# Patient Record
Sex: Female | Born: 1985 | Race: White | Hispanic: No | Marital: Married | Smoking: Never smoker
Health system: Southern US, Community
[De-identification: ages and names within clinical notes are randomized; demographics above are authoritative.]

## PROBLEM LIST (undated history)

## (undated) ENCOUNTER — Inpatient Hospital Stay (HOSPITAL_COMMUNITY): Payer: Self-pay

## (undated) DIAGNOSIS — Z789 Other specified health status: Secondary | ICD-10-CM

## (undated) HISTORY — PX: WISDOM TOOTH EXTRACTION: SHX21

---

## 2015-07-28 LAB — OB RESULTS CONSOLE GC/CHLAMYDIA
CHLAMYDIA, DNA PROBE: NEGATIVE
GC PROBE AMP, GENITAL: NEGATIVE

## 2015-07-28 LAB — OB RESULTS CONSOLE ABO/RH: RH Type: POSITIVE

## 2015-07-28 LAB — OB RESULTS CONSOLE HIV ANTIBODY (ROUTINE TESTING): HIV: NONREACTIVE

## 2015-07-28 LAB — OB RESULTS CONSOLE RPR: RPR: NONREACTIVE

## 2015-07-28 LAB — OB RESULTS CONSOLE RUBELLA ANTIBODY, IGM: RUBELLA: IMMUNE

## 2015-07-28 LAB — OB RESULTS CONSOLE HEPATITIS B SURFACE ANTIGEN: HEP B S AG: NEGATIVE

## 2015-07-28 LAB — OB RESULTS CONSOLE ANTIBODY SCREEN: ANTIBODY SCREEN: NEGATIVE

## 2016-02-05 LAB — OB RESULTS CONSOLE GBS: STREP GROUP B AG: NEGATIVE

## 2016-02-29 NOTE — H&P (Signed)
HPI: 31 y/o G1P0 @ 6276w3d estimated gestational age (as dated by LMP c/w 20 week ultrasound) presents for scheduled primary C-section due to breech presentation.   no Leaking of Fluid,   no Vaginal Bleeding,   no Uterine Contractions,  + Fetal Movement.  ROS: no HA, no epigastric pain, no visual changes.    Pregnancy complicated by: 1) Breech presentation- discussed ECV- pt declined, desire primary C-section 2) Size less than dates: Last US 12/5: breech/anterior/AGA (52%)  Prenatal Transfer Tool  Maternal Diabetes: No Genetic Screening: Normal Maternal Ultrasounds/Referrals: Normal Fetal Ultrasounds or other Referrals:  None Maternal Substance Abuse:  No Significant Maternal Medications:  None Significant Maternal Lab Results: Lab values include: Group B Strep negative   PNL:  GBS negative, Rub Immune, Hep B neg, RPR NR, HIV neg, GC/C neg, glucola: 82 Blood type: A positive  Immunizations: Tdap: 12/22/15 Flu: outside facility  OBHx: primip PMHx:  none Meds:  PNV Allergy:  No Known Allergies SurgHx: none SocHx:   no Tobacco, no  EtOH, no Illicit Drugs  O: Performed in office Gen. AAOx3, NAD CV.  RRR  No murmur.  Resp. CTAB, no wheeze or crackles. Abd. Gravid,  no tenderness,  no rigidity,  no guarding Extr.  no edema B/L , no calf tenderness  FHT: 130 (by doppler in office)  Labs: see orders  A/P:  31 y.o. G1P0 @ 3176w3d EGA who presents for scheduled C-section due to breech presentation -FWB:  Reassuring by doppler -NPO -LR @ 125cc/hr -SCDs to OR -Ancef 2g IV -Risk benefits and alternatives of cesarean section were discussed with the patient including but not limited to infection, bleeding, damage to bowel , bladder and baby with the need for further surgery. Pt voiced understanding and desires to proceed.   Myna HidalgoJennifer Georganne Siple, DO [phone] (pager) [phone] (office)

## 2016-03-01 ENCOUNTER — Encounter (HOSPITAL_COMMUNITY): Payer: Self-pay

## 2016-03-02 ENCOUNTER — Encounter (HOSPITAL_COMMUNITY)
Admission: RE | Admit: 2016-03-02 | Discharge: 2016-03-02 | Disposition: A | Discharge status: Home / Self Care | Payer: No Typology Code available for payment source | Source: Ambulatory Visit | Attending: Obstetrics & Gynecology | Admitting: Obstetrics & Gynecology

## 2016-03-02 DIAGNOSIS — Z3A39 39 weeks gestation of pregnancy: Secondary | ICD-10-CM | POA: Diagnosis not present

## 2016-03-02 DIAGNOSIS — Z0379 Encounter for other suspected maternal and fetal conditions ruled out: Secondary | ICD-10-CM | POA: Diagnosis not present

## 2016-03-02 DIAGNOSIS — Z538 Procedure and treatment not carried out for other reasons: Secondary | ICD-10-CM | POA: Diagnosis not present

## 2016-03-02 DIAGNOSIS — O321XX0 Maternal care for breech presentation, not applicable or unspecified: Secondary | ICD-10-CM | POA: Diagnosis present

## 2016-03-02 HISTORY — DX: Other specified health status: Z78.9

## 2016-03-02 LAB — CBC
HEMATOCRIT: 37.4 % (ref 36.0–46.0)
Hemoglobin: 13.1 g/dL (ref 12.0–15.0)
MCH: 30.9 pg (ref 26.0–34.0)
MCHC: 35 g/dL (ref 30.0–36.0)
MCV: 88.2 fL (ref 78.0–100.0)
PLATELETS: 184 10*3/uL (ref 150–400)
RBC: 4.24 MIL/uL (ref 3.87–5.11)
RDW: 13 % (ref 11.5–15.5)
WBC: 8.8 10*3/uL (ref 4.0–10.5)

## 2016-03-02 LAB — TYPE AND SCREEN
ABO/RH(D): A POS
ANTIBODY SCREEN: NEGATIVE

## 2016-03-02 LAB — ABO/RH: ABO/RH(D): A POS

## 2016-03-02 NOTE — Patient Instructions (Signed)
[address]oss  03/02/2016   Your procedure is scheduled on:  03/03/2016  [facility] through the [facility] Entrance of [facility] at 0530 AM.  Pick up the phone at the desk and dial [phone].   Call this number if you have problems the morning of surgery: [phone]   Remember:   Do not eat food:After Midnight.  Do not drink clear liquids: After Midnight.  Take these medicines the morning of surgery with A SIP OF WATER: na   Do not wear jewelry, make-up or nail polish.  Do not wear lotions, powders, or perfumes. Do not wear deodorant.  Do not shave 48 hours prior to surgery.  Do not bring valuables to the hospital.  [facility] is not   responsible for any belongings or valuables brought to the hospital.  Contacts, dentures or bridgework may not be worn into surgery.  Leave suitcase in the car. After surgery it may be brought to your room.  For patients admitted to the hospital, checkout time is 11:00 AM the day of              discharge.   Patients discharged the day of surgery will not be allowed to drive             home.  Name and phone number of your driver: na  Special Instructions:   N/A   Please read over the following fact sheets that you were given:   Surgical Site Infection Prevention

## 2016-03-03 ENCOUNTER — Encounter (HOSPITAL_COMMUNITY): Payer: Self-pay | Admitting: Anesthesiology

## 2016-03-03 ENCOUNTER — Encounter (HOSPITAL_COMMUNITY): Payer: Self-pay | Admitting: *Deleted

## 2016-03-03 ENCOUNTER — Telehealth (HOSPITAL_COMMUNITY): Payer: Self-pay | Admitting: *Deleted

## 2016-03-03 ENCOUNTER — Observation Stay (HOSPITAL_COMMUNITY)
Admission: AD | Admit: 2016-03-03 | Discharge: 2016-03-03 | Disposition: A | Discharge status: Home / Self Care | Payer: No Typology Code available for payment source | Source: Ambulatory Visit | Attending: Obstetrics & Gynecology | Admitting: Obstetrics & Gynecology

## 2016-03-03 ENCOUNTER — Encounter (HOSPITAL_COMMUNITY)
Admission: AD | Disposition: A | Discharge status: Home / Self Care | Payer: Self-pay | Source: Ambulatory Visit | Attending: Obstetrics & Gynecology

## 2016-03-03 DIAGNOSIS — Z0379 Encounter for other suspected maternal and fetal conditions ruled out: Principal | ICD-10-CM | POA: Insufficient documentation

## 2016-03-03 DIAGNOSIS — Z3493 Encounter for supervision of normal pregnancy, unspecified, third trimester: Secondary | ICD-10-CM

## 2016-03-03 DIAGNOSIS — Z3A39 39 weeks gestation of pregnancy: Secondary | ICD-10-CM | POA: Insufficient documentation

## 2016-03-03 DIAGNOSIS — Z538 Procedure and treatment not carried out for other reasons: Secondary | ICD-10-CM | POA: Insufficient documentation

## 2016-03-03 LAB — RPR: RPR Ser Ql: NONREACTIVE

## 2016-03-03 SURGERY — Surgical Case
Anesthesia: Regional

## 2016-03-03 MED ORDER — OXYTOCIN 10 UNIT/ML IJ SOLN
INTRAMUSCULAR | Status: AC
  Filled 2016-03-03: qty 4

## 2016-03-03 MED ORDER — ONDANSETRON HCL 4 MG/2ML IJ SOLN
INTRAMUSCULAR | Status: AC
  Filled 2016-03-03: qty 2

## 2016-03-03 MED ORDER — LACTATED RINGERS IV SOLN
INTRAVENOUS | Status: DC
  Administered 2016-03-03: 06:00:00 via INTRAVENOUS

## 2016-03-03 MED ORDER — CEFAZOLIN SODIUM-DEXTROSE 2-4 GM/100ML-% IV SOLN: 2.0000 g | INTRAVENOUS | Status: DC

## 2016-03-03 MED ORDER — MORPHINE SULFATE (PF) 0.5 MG/ML IJ SOLN
INTRAMUSCULAR | Status: AC
  Filled 2016-03-03: qty 10

## 2016-03-03 MED ORDER — FENTANYL CITRATE (PF) 100 MCG/2ML IJ SOLN
INTRAMUSCULAR | Status: AC
  Filled 2016-03-03: qty 2

## 2016-03-03 NOTE — Telephone Encounter (Signed)
Preadmission screen  

## 2016-03-03 NOTE — Progress Notes (Signed)
Pt presents today for scheduled C-section for breech presentation.  Bedside ultrasound performed- vertex presentation.  C-section not indicated, plan for expectant management with IOL around 41wks if she does not delivery prior to that date.  Today she reports no LOF, no VB, some increased pelvic pressure.    O: BP 121/88   Pulse 77   Temp 98.4 F (36.9 C) (Oral)   Resp 18   LMP 06/01/2015   SpO2 100%   Gen: NAD Abd: soft, non-tender SVE: closed/soft/posterior  FHT: 130 by US Ext: no edema, no calf tenderness bilaterally  30yo G1P0@ 6848w3d for scheduled C-section for breech, who is now vertex - Plan for discharge home with follow up in my office in one week - Reviewed labor room precautions  Myna HidalgoJennifer Jenita Rayfield, DO [phone] (pager) [phone] (office)

## 2016-03-03 NOTE — Discharge Instructions (Signed)
Introduction °Patient Name: ________________________________________________ Patient Due Date: ____________________ °What is a fetal movement count? °A fetal movement count is the number of times that you feel your baby move during a certain amount of time. This may also be called a fetal kick count. A fetal movement count is recommended for every pregnant woman. You may be asked to start counting fetal movements as early as week 28 of your pregnancy. °Pay attention to when your baby is most active. You may notice your baby's sleep and wake cycles. You may also notice things that make your baby move more. You should do a fetal movement count: °· When your baby is normally most active. °· At the same time each day. °A good time to count movements is while you are resting, after having something to eat and drink. °How do I count fetal movements? °1. Find a quiet, comfortable area. Sit, or lie down on your side. °2. Write down the date, the start time and stop time, and the number of movements that you felt between those two times. Take this information with you to your health care visits. °3. For 2 hours, count kicks, flutters, swishes, rolls, and jabs. You should feel at least 10 movements during 2 hours. °4. You may stop counting after you have felt 10 movements. °5. If you do not feel 10 movements in 2 hours, have something to eat and drink. Then, keep resting and counting for 1 hour. If you feel at least 4 movements during that hour, you may stop counting. °Contact a health care provider if: °· You feel fewer than 4 movements in 2 hours. °· Your baby is not moving like he or she usually does. °Date: ____________ Start time: ____________ Stop time: ____________ Movements: ____________ °Date: ____________ Start time: ____________ Stop time: ____________ Movements: ____________ °Date: ____________ Start time: ____________ Stop time: ____________ Movements: ____________ °Date: ____________ Start time: ____________  Stop time: ____________ Movements: ____________ °Date: ____________ Start time: ____________ Stop time: ____________ Movements: ____________ °Date: ____________ Start time: ____________ Stop time: ____________ Movements: ____________ °Date: ____________ Start time: ____________ Stop time: ____________ Movements: ____________ °Date: ____________ Start time: ____________ Stop time: ____________ Movements: ____________ °Date: ____________ Start time: ____________ Stop time: ____________ Movements: ____________ °This information is not intended to replace advice given to you by your health care provider. Make sure you discuss any questions you have with your health care provider. °Document Released: 02/24/2006 Document Revised: 09/24/2015 Document Reviewed: 03/06/2015 °Elsevier Interactive Patient Education © 2017 [employer] °Vaginal Delivery °Vaginal delivery means that you will give birth by pushing your baby out of your birth canal (vagina). A team of health care providers will help you before, during, and after vaginal delivery. Birth experiences are unique for every woman and every pregnancy, and birth experiences vary depending on where you choose to give birth. °What should I do to prepare for my baby's birth? °Before your baby is born, it is important to talk with your health care provider about: °· Your labor and delivery preferences. These may include: °¨ Medicines that you may be given. °¨ How you will manage your pain. This might include non-medical pain relief techniques or injectable pain relief such as epidural analgesia. °¨ How you and your baby will be monitored during labor and delivery. °¨ Who may be in the labor and delivery room with you. °¨ Your feelings about surgical delivery of your baby (cesarean delivery, or C-section) if this becomes necessary. °¨ Your feelings about receiving donated blood through an   IV tube (blood transfusion) if this becomes necessary. °· Whether you are able: °¨ To  take pictures or videos of the birth. °¨ To eat during labor and delivery. °¨ To move around, walk, or change positions during labor and delivery. °· What to expect after your baby is born, such as: °¨ Whether delayed umbilical cord clamping and cutting is offered. °¨ Who will care for your baby right after birth. °¨ Medicines or tests that may be recommended for your baby. °¨ Whether breastfeeding is supported in your hospital or birth center. °¨ How long you will be in the hospital or birth center. °· How any medical conditions you have may affect your baby or your labor and delivery experience. °To prepare for your baby's birth, you should also: °· Attend all of your health care visits before delivery (prenatal visits) as recommended by your health care provider. This is important. °· Prepare your home for your baby's arrival. Make sure that you have: °¨ Diapers. °¨ Baby clothing. °¨ Feeding equipment. °¨ Safe sleeping arrangements for you and your baby. °· Install a car seat in your vehicle. Have your car seat checked by a certified car seat installer to make sure that it is installed safely. °· Think about who will help you with your new baby at home for at least the first several weeks after delivery. °What can I expect when I arrive at the birth center or hospital? °Once you are in labor and have been admitted into the hospital or birth center, your health care provider may: °· Review your pregnancy history and any concerns you have. °· Insert an IV tube into one of your veins. This is used to give you fluids and medicines. °· Check your blood pressure, pulse, temperature, and heart rate (vital signs). °· Check whether your bag of water (amniotic sac) has broken (ruptured). °· Talk with you about your birth plan and discuss pain control options. °Monitoring °Your health care provider may monitor your contractions (uterine monitoring) and your baby's heart rate (fetal monitoring). You may need to be  monitored: °· Often, but not continuously (intermittently). °· All the time or for long periods at a time (continuously). Continuous monitoring may be needed if: °¨ You are taking certain medicines, such as medicine to relieve pain or make your contractions stronger. °¨ You have pregnancy or labor complications. °Monitoring may be done by: °· Placing a special stethoscope or a handheld monitoring device on your abdomen to check your baby's heartbeat, and feeling your abdomen for contractions. This method of monitoring does not continuously record your baby's heartbeat or your contractions. °· Placing monitors on your abdomen (external monitors) to record your baby's heartbeat and the frequency and length of contractions. You may not have to wear external monitors all the time. °· Placing monitors inside of your uterus (internal monitors) to record your baby's heartbeat and the frequency, length, and strength of your contractions. °¨ Your health care provider may use internal monitors if he or she needs more information about the strength of your contractions or your baby's heart rate. °¨ Internal monitors are put in place by passing a thin, flexible wire through your vagina and into your uterus. Depending on the type of monitor, it may remain in your uterus or on your baby's head until birth. °¨ Your health care provider will discuss the benefits and risks of internal monitoring with you and will ask for your permission before inserting the monitors. °· Telemetry. This is a   type of continuous monitoring that can be done with external or internal monitors. Instead of having to stay in bed, you are able to move around during telemetry. Ask your health care provider if telemetry is an option for you. °Physical exam °Your health care provider may perform a physical exam. This may include: °· Checking whether your baby is positioned: °¨ With the head toward your vagina (head-down). This is most common. °¨ With the head  toward the top of your uterus (head-up or breech). If your baby is in a breech position, your health care provider may try to turn your baby to a head-down position so you can deliver vaginally. If it does not seem that your baby can be born vaginally, your provider may recommend surgery to deliver your baby. In rare cases, you may be able to deliver vaginally if your baby is head-up (breech delivery). °¨ Lying sideways (transverse). Babies that are lying sideways cannot be delivered vaginally. °· Checking your cervix to determine: °¨ Whether it is thinning out (effacing). °¨ Whether it is opening up (dilating). °¨ How low your baby has moved into your birth canal. °What are the three stages of labor and delivery? °  °Normal labor and delivery is divided into the following three stages: °Stage 1 °· Stage 1 is the longest stage of labor, and it can last for hours or days. Stage 1 includes: °¨ Early labor. This is when contractions may be irregular, or regular and mild. Generally, early labor contractions are more than 10 minutes apart. °¨ Active labor. This is when contractions get longer, more regular, more frequent, and more intense. °¨ The transition phase. This is when contractions happen very close together, are very intense, and may last longer than during any other part of labor. °· Contractions generally feel mild, infrequent, and irregular at first. They get stronger, more frequent (about every 2-3 minutes), and more regular as you progress from early labor through active labor and transition. °· Many women progress through stage 1 naturally, but you may need help to continue making progress. If this happens, your health care provider may talk with you about: °¨ Rupturing your amniotic sac if it has not ruptured yet. °¨ Giving you medicine to help make your contractions stronger and more frequent. °· Stage 1 ends when your cervix is completely dilated to 4 inches (10 cm) and completely effaced. This happens  at the end of the transition phase. °Stage 2 °· Once your cervix is completely effaced and dilated to 4 inches (10 cm), you may start to feel an urge to push. It is common for the body to naturally take a rest before feeling the urge to push, especially if you received an epidural or certain other pain medicines. This rest period may last for up to 1-2 hours, depending on your unique labor experience. °· During stage 2, contractions are generally less painful, because pushing helps relieve contraction pain. Instead of contraction pain, you may feel stretching and burning pain, especially when the widest part of your baby's head passes through the vaginal opening (crowning). °· Your health care provider will closely monitor your pushing progress and your baby's progress through the vagina during stage 2. °· Your health care provider may massage the area of skin between your vaginal opening and anus (perineum) or apply warm compresses to your perineum. This helps it stretch as the baby's head starts to crown, which can help prevent perineal tearing. °¨ In some cases, an incision may   be made in your perineum (episiotomy) to allow the baby to pass through the vaginal opening. An episiotomy helps to make the opening of the vagina larger to allow more room for the baby to fit through. °· It is very important to breathe and focus so your health care provider can control the delivery of your baby's head. Your health care provider may have you decrease the intensity of your pushing, to help prevent perineal tearing. °· After delivery of your baby's head, the shoulders and the rest of the body generally deliver very quickly and without difficulty. °· Once your baby is delivered, the umbilical cord may be cut right away, or this may be delayed for 1-2 minutes, depending on your baby's health. This may vary among health care providers, hospitals, and birth centers. °· If you and your baby are healthy enough, your baby may be  placed on your chest or abdomen to help maintain the baby's temperature and to help you bond with each other. Some mothers and babies start breastfeeding at this time. Your health care team will dry your baby and help keep your baby warm during this time. °· Your baby may need immediate care if he or she: °¨ Showed signs of distress during labor. °¨ Has a medical condition. °¨ Was born too early (prematurely). °¨ Had a bowel movement before birth (meconium). °¨ Shows signs of difficulty transitioning from being inside the uterus to being outside of the uterus. °If you are planning to breastfeed, your health care team will help you begin a feeding. °Stage 3 °· The third stage of labor starts immediately after the birth of your baby and ends after you deliver the placenta. The placenta is an organ that develops during pregnancy to provide oxygen and nutrients to your baby in the womb. °· Delivering the placenta may require some pushing, and you may have mild contractions. Breastfeeding can stimulate contractions to help you deliver the placenta. °· After the placenta is delivered, your uterus should tighten (contract) and become firm. This helps to stop bleeding in your uterus. To help your uterus contract and to control bleeding, your health care provider may: °¨ Give you medicine by injection, through an IV tube, by mouth, or through your rectum (rectally). °¨ Massage your abdomen or perform a vaginal exam to remove any blood clots that are left in your uterus. °¨ Empty your bladder by placing a thin, flexible tube (catheter) into your bladder. °¨ Encourage you to breastfeed your baby. °After labor is over, you and your baby will be monitored closely to ensure that you are both healthy until you are ready to go home. Your health care team will teach you how to care for yourself and your baby. °This information is not intended to replace advice given to you by your health care provider. Make sure you discuss any  questions you have with your health care provider. °Document Released: 11/04/2007 Document Revised: 08/15/2015 Document Reviewed: 02/09/2015 °Elsevier Interactive Patient Education © 2017 [employer] Inc. ° °

## 2016-03-10 NOTE — H&P (Addendum)
HPI: 31 y/o G1P0 @ 5649w4d estimated gestational age (as dated by LMP c/w 20 week ultrasound) presents for scheduled IOL due to postdates.  no Leaking of Fluid,   no Vaginal Bleeding,   no Uterine Contractions,  + Fetal Movement.  ROS: no HA, no epigastric pain, no visual changes.    Pregnancy complicated by: 1) Size less than dates: Last US 12/5: breech/anterior/AGA (52%) 2) Prior breech presentation- presented for scheduled C-section at 39wk and was found to be vertex  Prenatal Transfer Tool  Maternal Diabetes: No Genetic Screening: Normal Maternal Ultrasounds/Referrals: Normal Fetal Ultrasounds or other Referrals:  None Maternal Substance Abuse:  No Significant Maternal Medications:  None Significant Maternal Lab Results: Lab values include: Group B Strep negative   PNL:  GBS negative, Rub Immune, Hep B neg, RPR NR, HIV neg, GC/C neg, glucola: 82 Blood type: A positive  Immunizations: Tdap: 12/22/15 Flu: outside facility  OBHx: primip PMHx:  none Meds:  PNV Allergy:  No Known Allergies SurgHx: none SocHx:   no Tobacco, no  EtOH, no Illicit Drugs  O: Performed in office on 03/10/16 Gen. AAOx3, NAD CV.  RRR   Resp. Normal respiratory effort Abd. Gravid,  no tenderness,  no rigidity,  no guarding Extr.  no edema B/L , no calf tenderness  FHT: 130 (by doppler in office) BSUS: vertex GU: 1/50/-3, confirmed vertex   Labs: see orders  A/P:  31 y.o. G1P0 @ 8449w4d EGA who presents for scheduled IOL for full term prengnacy -FWB:  Reassuring by doppler -Labor: plan for cytotec overnight -Pain management: IV or epidural upon request -GBS negative  Myna HidalgoJennifer Meggan Dhaliwal, DO [phone] (pager) [phone] (office)

## 2016-03-11 ENCOUNTER — Inpatient Hospital Stay (HOSPITAL_COMMUNITY): Payer: No Typology Code available for payment source | Admitting: Anesthesiology

## 2016-03-11 ENCOUNTER — Inpatient Hospital Stay (HOSPITAL_COMMUNITY)
Admission: AD | Admit: 2016-03-11 | Discharge: 2016-03-15 | DRG: 765 | Disposition: A | Discharge status: Home / Self Care | Payer: No Typology Code available for payment source | Source: Ambulatory Visit | Attending: Obstetrics & Gynecology | Admitting: Obstetrics & Gynecology

## 2016-03-11 ENCOUNTER — Encounter (HOSPITAL_COMMUNITY): Payer: Self-pay

## 2016-03-11 DIAGNOSIS — O9081 Anemia of the puerperium: Secondary | ICD-10-CM | POA: Diagnosis not present

## 2016-03-11 DIAGNOSIS — Z3A40 40 weeks gestation of pregnancy: Secondary | ICD-10-CM | POA: Diagnosis not present

## 2016-03-11 DIAGNOSIS — O480 Post-term pregnancy: Secondary | ICD-10-CM | POA: Diagnosis present

## 2016-03-11 DIAGNOSIS — D649 Anemia, unspecified: Secondary | ICD-10-CM | POA: Diagnosis not present

## 2016-03-11 DIAGNOSIS — Z98891 History of uterine scar from previous surgery: Secondary | ICD-10-CM

## 2016-03-11 DIAGNOSIS — Z3493 Encounter for supervision of normal pregnancy, unspecified, third trimester: Secondary | ICD-10-CM

## 2016-03-11 LAB — CBC
HCT: 36.2 % (ref 36.0–46.0)
Hemoglobin: 12.9 g/dL (ref 12.0–15.0)
MCH: 31.4 pg (ref 26.0–34.0)
MCHC: 35.6 g/dL (ref 30.0–36.0)
MCV: 88.1 fL (ref 78.0–100.0)
PLATELETS: 210 10*3/uL (ref 150–400)
RBC: 4.11 MIL/uL (ref 3.87–5.11)
RDW: 12.8 % (ref 11.5–15.5)
WBC: 10.5 10*3/uL (ref 4.0–10.5)

## 2016-03-11 LAB — RPR: RPR Ser Ql: NONREACTIVE

## 2016-03-11 LAB — TYPE AND SCREEN
ABO/RH(D): A POS
ANTIBODY SCREEN: NEGATIVE

## 2016-03-11 MED ORDER — PHENYLEPHRINE 40 MCG/ML (10ML) SYRINGE FOR IV PUSH (FOR BLOOD PRESSURE SUPPORT): 80.0000 ug | PREFILLED_SYRINGE | INTRAVENOUS | Status: DC | PRN

## 2016-03-11 MED ORDER — OXYTOCIN 40 UNITS IN LACTATED RINGERS INFUSION - SIMPLE MED
1.0000 m[IU]/min | INTRAVENOUS | Status: DC
  Filled 2016-03-11: qty 1000

## 2016-03-11 MED ORDER — LACTATED RINGERS IV SOLN
500.0000 mL | Freq: Once | INTRAVENOUS | Status: AC
  Administered 2016-03-11: 1000 mL via INTRAVENOUS

## 2016-03-11 MED ORDER — EPHEDRINE 5 MG/ML INJ: 10.0000 mg | INTRAVENOUS | Status: DC | PRN

## 2016-03-11 MED ORDER — OXYTOCIN 40 UNITS IN LACTATED RINGERS INFUSION - SIMPLE MED: 1.0000 m[IU]/min | INTRAVENOUS | Status: DC

## 2016-03-11 MED ORDER — LIDOCAINE HCL (PF) 1 % IJ SOLN
INTRAMUSCULAR | Status: DC | PRN
  Administered 2016-03-11 – 2016-03-12 (×4): 5 mL via EPIDURAL

## 2016-03-11 MED ORDER — FENTANYL 2.5 MCG/ML BUPIVACAINE 1/10 % EPIDURAL INFUSION (WH - ANES): 14.0000 mL/h | INTRAMUSCULAR | Status: DC | PRN

## 2016-03-11 MED ORDER — DIPHENHYDRAMINE HCL 50 MG/ML IJ SOLN: 12.5000 mg | INTRAMUSCULAR | Status: DC | PRN

## 2016-03-11 MED ORDER — ACETAMINOPHEN 325 MG PO TABS
650.0000 mg | ORAL_TABLET | ORAL | Status: DC | PRN
  Administered 2016-03-11: 650 mg via ORAL
  Filled 2016-03-11: qty 2

## 2016-03-11 MED ORDER — OXYTOCIN 40 UNITS IN LACTATED RINGERS INFUSION - SIMPLE MED: 2.5000 [IU]/h | INTRAVENOUS | Status: DC

## 2016-03-11 MED ORDER — ONDANSETRON HCL 4 MG/2ML IJ SOLN
4.0000 mg | Freq: Four times a day (QID) | INTRAMUSCULAR | Status: DC | PRN
  Administered 2016-03-11: 4 mg via INTRAVENOUS
  Filled 2016-03-11: qty 2

## 2016-03-11 MED ORDER — LIDOCAINE HCL (PF) 1 % IJ SOLN: 30.0000 mL | INTRAMUSCULAR | Status: DC | PRN

## 2016-03-11 MED ORDER — LACTATED RINGERS IV SOLN
INTRAVENOUS | Status: DC
  Administered 2016-03-11 (×4): via INTRAVENOUS

## 2016-03-11 MED ORDER — PHENYLEPHRINE 40 MCG/ML (10ML) SYRINGE FOR IV PUSH (FOR BLOOD PRESSURE SUPPORT)
80.0000 ug | PREFILLED_SYRINGE | INTRAVENOUS | Status: DC | PRN
Start: 2016-03-11 — End: 2016-03-12
  Filled 2016-03-11: qty 10

## 2016-03-11 MED ORDER — OXYTOCIN 40 UNITS IN LACTATED RINGERS INFUSION - SIMPLE MED
1.0000 m[IU]/min | INTRAVENOUS | Status: DC
  Administered 2016-03-11: 1 m[IU]/min via INTRAVENOUS

## 2016-03-11 MED ORDER — TERBUTALINE SULFATE 1 MG/ML IJ SOLN: 0.2500 mg | Freq: Once | INTRAMUSCULAR | Status: DC | PRN

## 2016-03-11 MED ORDER — FENTANYL 2.5 MCG/ML BUPIVACAINE 1/10 % EPIDURAL INFUSION (WH - ANES)
14.0000 mL/h | INTRAMUSCULAR | Status: DC | PRN
  Administered 2016-03-11 (×2): 14 mL/h via EPIDURAL
  Filled 2016-03-11 (×2): qty 100

## 2016-03-11 MED ORDER — OXYCODONE-ACETAMINOPHEN 5-325 MG PO TABS: 1.0000 | ORAL_TABLET | ORAL | Status: DC | PRN

## 2016-03-11 MED ORDER — OXYTOCIN BOLUS FROM INFUSION: 500.0000 mL | Freq: Once | INTRAVENOUS | Status: DC

## 2016-03-11 MED ORDER — OXYCODONE-ACETAMINOPHEN 5-325 MG PO TABS: 2.0000 | ORAL_TABLET | ORAL | Status: DC | PRN

## 2016-03-11 MED ORDER — MISOPROSTOL 25 MCG QUARTER TABLET
25.0000 ug | ORAL_TABLET | ORAL | Status: DC | PRN
  Administered 2016-03-11 (×2): 25 ug via VAGINAL
  Filled 2016-03-11 (×2): qty 0.25

## 2016-03-11 MED ORDER — TERBUTALINE SULFATE 1 MG/ML IJ SOLN
0.2500 mg | Freq: Once | INTRAMUSCULAR | Status: DC | PRN
  Filled 2016-03-11: qty 1

## 2016-03-11 MED ORDER — SOD CITRATE-CITRIC ACID 500-334 MG/5ML PO SOLN
30.0000 mL | ORAL | Status: DC | PRN
  Administered 2016-03-12: 30 mL via ORAL
  Filled 2016-03-11: qty 15

## 2016-03-11 MED ORDER — LACTATED RINGERS IV SOLN: 500.0000 mL | INTRAVENOUS | Status: DC | PRN

## 2016-03-11 MED ORDER — LACTATED RINGERS IV SOLN: 500.0000 mL | Freq: Once | INTRAVENOUS | Status: DC

## 2016-03-11 NOTE — Anesthesia Pain Management Evaluation Note (Signed)
  CRNA Pain Management Visit Note  Patient: Martha Mccormick, 31 y.o., female  "Hello I am a member of the anesthesia team at [facility]. We have an anesthesia team available at all times to provide care throughout the hospital, including epidural management and anesthesia for C-section. I don't know your plan for the delivery whether it a natural birth, water birth, IV sedation, nitrous supplementation, doula or epidural, but we want to meet your pain goals."   1.Was your pain managed to your expectations on prior hospitalizations?   No prior hospitalizations  2.What is your expectation for pain management during this hospitalization?     Epidural  3.How can we help you reach that goal? epidural  Record the patient's initial score and the patient's pain goal.   Pain: 4  Pain Goal: 7 [facility] wants you to be able to say your pain was always managed very well.  Edison PaceWILKERSON,Amisadai Woodford 03/11/2016

## 2016-03-11 NOTE — Anesthesia Procedure Notes (Signed)
Epidural Patient location during procedure: OB  Staffing Anesthesiologist: Nataliah Hatlestad Performed: anesthesiologist   Preanesthetic Checklist Completed: patient identified, site marked, pre-op evaluation, timeout performed, IV checked, risks and benefits discussed and monitors and equipment checked  Epidural Patient position: sitting Prep: DuraPrep Patient monitoring: heart rate, cardiac monitor, continuous pulse ox and blood pressure Approach: midline Location: L2-L3 Injection technique: LOR saline  Needle:  Needle type: Tuohy  Needle gauge: 17 G Needle length: 9 cm Needle insertion depth: 5 cm Catheter type: closed end flexible Catheter size: 19 Gauge Catheter at skin depth: 10 cm Test dose: negative and Other  Assessment Events: blood not aspirated, injection not painful, no injection resistance and negative IV test  Additional Notes Informed consent obtained prior to proceeding including risk of failure, 1% risk of PDPH, risk of minor discomfort and bruising.  Discussed rare but serious complications including epidural abscess, permanent nerve injury, epidural hematoma.  Discussed alternatives to epidural analgesia and patient desires to proceed.  Timeout performed pre-procedure verifying patient name, procedure, and platelet count.  Patient tolerated procedure well. Reason for block:procedure for pain

## 2016-03-11 NOTE — Progress Notes (Signed)
OB PN:  S: Pt resting comfortably with epidural  O: BP (!) 107/57   Pulse (!) 57   Temp 97.9 F (36.6 C) (Oral)   Resp 18   Ht 5\' 4"  (1.626 m)   Wt 152 lb (68.9 kg)   LMP 06/01/2015   SpO2 100%   BMI 26.09 kg/m   FHT: 120bpm, moderate variablity, + accels, occasional variable decels Toco: q1-623min SVE: 5/70/-2, AROM clear fluid  A/P: 31 y.o. G1P0 @ 1184w4d for IOL 1. FWB: Cat. I 2. Labor: expectant management Pain: continue epidural GBS: negative  Myna HidalgoJennifer Emelio Schneller, DO [phone] (pager) [phone] (office)

## 2016-03-11 NOTE — Progress Notes (Signed)
OB PN:  S: Pt starting to feel more discomfort  O: BP 118/79   Pulse (!) 54   Temp 97.9 F (36.6 C) (Oral)   Resp 18   Ht 5\' 4"  (1.626 m)   Wt 152 lb (68.9 kg)   LMP 06/01/2015   BMI 26.09 kg/m   FHT: 120bpm, moderate variablity, + accels, occasional variable decels Toco: q1-653min SVE: 3-4/50/-2, Cook in vaginal vault-removed  A/P: 31 y.o. G1P0 @ 7718w4d for IOL 1. FWB: Cat. II- encouraged pt to stay in lateral position instead of side pt repositioned, will consider fluid bolus or Terb if contractions do not space out 2. Labor: expectant management Pain: IV or epidural upon request GBS: negative  Myna HidalgoJennifer Trong Gosling, DO [phone]) [phone] (office)

## 2016-03-11 NOTE — Progress Notes (Signed)
OB PN:  S: Pt resting comfortably with epidural  O: BP 110/68   Pulse 60   Temp 98 F (36.7 C)   Resp 16   Ht 5\' 4"  (1.626 m)   Wt 152 lb (68.9 kg)   LMP 06/01/2015   SpO2 98%   BMI 26.09 kg/m   FHT: 120bpm, moderate variablity, + accels, prolonged decel x 1 to 70bpm for 3min with spontaneous recovery, Pitocin discontinued Toco: q1-543min SVE: 5/70/-2, internal monitors placed  A/P: 31 y.o. G1P0 @ 4825w4d for IOL 1. FWB: Cat. II- pt repositioned, Pitocin off, IUPC and FSE placed 2. Labor: Reviewed management plan with patient, once FHT Cat. I plan to restart Pitocin, should decels return reassess management plan at that time. Pain: continue epidural GBS: negative  Myna HidalgoJennifer Latarsha Zani, DO [phone] (pager) [phone] (office)

## 2016-03-11 NOTE — Anesthesia Preprocedure Evaluation (Signed)
Anesthesia Evaluation  Patient identified by MRN, date of birth, ID band Patient awake    Reviewed: Allergy & Precautions, H&P , NPO status , Patient's Chart, lab work & pertinent test results  Airway Mallampati: II   Neck ROM: full    Dental   Pulmonary neg pulmonary ROS,    breath sounds clear to auscultation       Cardiovascular negative cardio ROS   Rhythm:regular Rate:Normal     Neuro/Psych    GI/Hepatic   Endo/Other    Renal/GU      Musculoskeletal   Abdominal   Peds  Hematology   Anesthesia Other Findings   Reproductive/Obstetrics (+) Pregnancy                             Anesthesia Physical Anesthesia Plan  ASA: I  Anesthesia Plan: Epidural   Post-op Pain Management:    Induction: Intravenous  Airway Management Planned: Natural Airway  Additional Equipment:   Intra-op Plan:   Post-operative Plan:   Informed Consent: I have reviewed the patients History and Physical, chart, labs and discussed the procedure including the risks, benefits and alternatives for the proposed anesthesia with the patient or authorized representative who has indicated his/her understanding and acceptance.     Plan Discussed with: CRNA, Anesthesiologist and Surgeon  Anesthesia Plan Comments:         Anesthesia Quick Evaluation

## 2016-03-11 NOTE — Progress Notes (Signed)
Patients FHT was recorded under another patient's name from 03/11/2016 0032 to 03/11/2016 0211. Tracing printed and placed in patients chart for records.

## 2016-03-11 NOTE — Progress Notes (Signed)
OB PN:  S: Pt resting comfortably, feeling "crampy" contractions  O: BP 105/65   Pulse (!) 57   Temp 98 F (36.7 C) (Oral)   Resp 16   Ht 5\' 4"  (1.626 m)   Wt 152 lb (68.9 kg)   LMP 06/01/2015   BMI 26.09 kg/m   FHT: 120bpm, moderate variablity, + accels, variable decelsx2 following Cook placement due to tachysystole Toco: q1-383min SVE: 1-2/50/-3, Cook balloon placed  A/P: 31 y.o. G1P0 @ 2124w4d for IOL 1. FWB: Cat. II- pt repositioned, will consider fluid bolus or Terb if contractions do not space out 2. Labor: cytotec last placed around 5am this am, Foley in place, will hold off on Pitocin.  Plan to start if contractions space out Pain: IV or epidural upon request GBS: negative  Myna HidalgoJennifer Gabrielle Mester, DO [phone] (pager) [phone] (office)

## 2016-03-12 ENCOUNTER — Encounter (HOSPITAL_COMMUNITY): Payer: Self-pay

## 2016-03-12 ENCOUNTER — Encounter (HOSPITAL_COMMUNITY)
Admission: AD | Disposition: A | Discharge status: Home / Self Care | Payer: Self-pay | Source: Ambulatory Visit | Attending: Obstetrics & Gynecology

## 2016-03-12 DIAGNOSIS — Z98891 History of uterine scar from previous surgery: Secondary | ICD-10-CM

## 2016-03-12 LAB — CBC
HEMATOCRIT: 29.1 % — AB (ref 36.0–46.0)
HEMOGLOBIN: 10.2 g/dL — AB (ref 12.0–15.0)
MCH: 30.8 pg (ref 26.0–34.0)
MCHC: 35.1 g/dL (ref 30.0–36.0)
MCV: 87.9 fL (ref 78.0–100.0)
Platelets: 166 10*3/uL (ref 150–400)
RBC: 3.31 MIL/uL — AB (ref 3.87–5.11)
RDW: 13 % (ref 11.5–15.5)
WBC: 17.4 10*3/uL — ABNORMAL HIGH (ref 4.0–10.5)

## 2016-03-12 SURGERY — Surgical Case
Anesthesia: Epidural

## 2016-03-12 MED ORDER — MENTHOL 3 MG MT LOZG
1.0000 | LOZENGE | OROMUCOSAL | Status: DC | PRN
  Filled 2016-03-12: qty 9

## 2016-03-12 MED ORDER — ACETAMINOPHEN 500 MG PO TABS
1000.0000 mg | ORAL_TABLET | Freq: Four times a day (QID) | ORAL | Status: AC
  Administered 2016-03-12 – 2016-03-13 (×4): 1000 mg via ORAL
  Filled 2016-03-12 (×4): qty 2

## 2016-03-12 MED ORDER — NALBUPHINE HCL 10 MG/ML IJ SOLN: 5.0000 mg | INTRAMUSCULAR | Status: DC | PRN

## 2016-03-12 MED ORDER — DIPHENHYDRAMINE HCL 50 MG/ML IJ SOLN: 12.5000 mg | INTRAMUSCULAR | Status: DC | PRN

## 2016-03-12 MED ORDER — EPHEDRINE SULFATE 50 MG/ML IJ SOLN
INTRAMUSCULAR | Status: DC | PRN
  Administered 2016-03-12 (×2): 5 mg via INTRAVENOUS

## 2016-03-12 MED ORDER — OXYTOCIN 40 UNITS IN LACTATED RINGERS INFUSION - SIMPLE MED: 2.5000 [IU]/h | INTRAVENOUS | Status: AC

## 2016-03-12 MED ORDER — KETOROLAC TROMETHAMINE 30 MG/ML IJ SOLN: 30.0000 mg | Freq: Four times a day (QID) | INTRAMUSCULAR | Status: AC | PRN

## 2016-03-12 MED ORDER — SCOPOLAMINE 1 MG/3DAYS TD PT72: 1.0000 | MEDICATED_PATCH | Freq: Once | TRANSDERMAL | Status: DC

## 2016-03-12 MED ORDER — FERROUS SULFATE 325 (65 FE) MG PO TABS
325.0000 mg | ORAL_TABLET | Freq: Two times a day (BID) | ORAL | Status: DC
  Administered 2016-03-12 – 2016-03-15 (×7): 325 mg via ORAL
  Filled 2016-03-12 (×7): qty 1

## 2016-03-12 MED ORDER — SIMETHICONE 80 MG PO CHEW
80.0000 mg | CHEWABLE_TABLET | ORAL | Status: DC
  Administered 2016-03-13 – 2016-03-15 (×3): 80 mg via ORAL
  Filled 2016-03-12 (×5): qty 1

## 2016-03-12 MED ORDER — LACTATED RINGERS IV SOLN: INTRAVENOUS | Status: DC

## 2016-03-12 MED ORDER — METHYLERGONOVINE MALEATE 0.2 MG/ML IJ SOLN
INTRAMUSCULAR | Status: AC
  Filled 2016-03-12: qty 1

## 2016-03-12 MED ORDER — CEFAZOLIN SODIUM-DEXTROSE 2-4 GM/100ML-% IV SOLN
INTRAVENOUS | Status: AC
  Filled 2016-03-12: qty 100

## 2016-03-12 MED ORDER — MEPERIDINE HCL 25 MG/ML IJ SOLN: 6.2500 mg | INTRAMUSCULAR | Status: DC | PRN

## 2016-03-12 MED ORDER — DEXAMETHASONE SODIUM PHOSPHATE 4 MG/ML IJ SOLN
INTRAMUSCULAR | Status: DC | PRN
  Administered 2016-03-12: 4 mg via INTRAVENOUS

## 2016-03-12 MED ORDER — LACTATED RINGERS IV SOLN
INTRAVENOUS | Status: DC | PRN
  Administered 2016-03-12 (×3): via INTRAVENOUS

## 2016-03-12 MED ORDER — WITCH HAZEL-GLYCERIN EX PADS: 1.0000 "application " | MEDICATED_PAD | CUTANEOUS | Status: DC | PRN

## 2016-03-12 MED ORDER — IBUPROFEN 600 MG PO TABS
600.0000 mg | ORAL_TABLET | Freq: Four times a day (QID) | ORAL | Status: DC | PRN
  Administered 2016-03-13 – 2016-03-15 (×4): 600 mg via ORAL

## 2016-03-12 MED ORDER — ONDANSETRON HCL 4 MG/2ML IJ SOLN
INTRAMUSCULAR | Status: DC | PRN
Start: 2016-03-12 — End: 2016-03-12
  Administered 2016-03-12: 4 mg via INTRAVENOUS

## 2016-03-12 MED ORDER — ZOLPIDEM TARTRATE 5 MG PO TABS: 5.0000 mg | ORAL_TABLET | Freq: Every evening | ORAL | Status: DC | PRN

## 2016-03-12 MED ORDER — ACETAMINOPHEN 325 MG PO TABS
650.0000 mg | ORAL_TABLET | ORAL | Status: DC | PRN
  Administered 2016-03-13 – 2016-03-15 (×6): 650 mg via ORAL
  Filled 2016-03-12 (×6): qty 2

## 2016-03-12 MED ORDER — MEPERIDINE HCL 25 MG/ML IJ SOLN
6.2500 mg | INTRAMUSCULAR | Status: DC | PRN
Start: 2016-03-12 — End: 2016-03-12

## 2016-03-12 MED ORDER — SIMETHICONE 80 MG PO CHEW
80.0000 mg | CHEWABLE_TABLET | ORAL | Status: DC | PRN
  Filled 2016-03-12: qty 1

## 2016-03-12 MED ORDER — HYDROMORPHONE HCL 1 MG/ML IJ SOLN: 0.2500 mg | INTRAMUSCULAR | Status: DC | PRN

## 2016-03-12 MED ORDER — KETOROLAC TROMETHAMINE 30 MG/ML IJ SOLN
30.0000 mg | Freq: Once | INTRAMUSCULAR | Status: AC
  Administered 2016-03-12: 30 mg via INTRAMUSCULAR

## 2016-03-12 MED ORDER — KETOROLAC TROMETHAMINE 30 MG/ML IJ SOLN: 30.0000 mg | Freq: Once | INTRAMUSCULAR | Status: DC

## 2016-03-12 MED ORDER — ONDANSETRON HCL 4 MG/2ML IJ SOLN: 4.0000 mg | Freq: Three times a day (TID) | INTRAMUSCULAR | Status: DC | PRN

## 2016-03-12 MED ORDER — NALOXONE HCL 2 MG/2ML IJ SOSY
1.0000 ug/kg/h | PREFILLED_SYRINGE | INTRAVENOUS | Status: DC | PRN
  Filled 2016-03-12: qty 2

## 2016-03-12 MED ORDER — SODIUM CHLORIDE 0.9% FLUSH: 3.0000 mL | INTRAVENOUS | Status: DC | PRN

## 2016-03-12 MED ORDER — SENNOSIDES-DOCUSATE SODIUM 8.6-50 MG PO TABS
2.0000 | ORAL_TABLET | ORAL | Status: DC
  Administered 2016-03-13 – 2016-03-15 (×3): 2 via ORAL
  Filled 2016-03-12 (×5): qty 2

## 2016-03-12 MED ORDER — SIMETHICONE 80 MG PO CHEW
80.0000 mg | CHEWABLE_TABLET | Freq: Three times a day (TID) | ORAL | Status: DC
  Administered 2016-03-12 – 2016-03-15 (×9): 80 mg via ORAL
  Filled 2016-03-12 (×15): qty 1

## 2016-03-12 MED ORDER — MORPHINE SULFATE (PF) 0.5 MG/ML IJ SOLN
INTRAMUSCULAR | Status: AC
Start: 2016-03-12 — End: 2016-03-12
  Filled 2016-03-12: qty 10

## 2016-03-12 MED ORDER — PRENATAL MULTIVITAMIN CH
1.0000 | ORAL_TABLET | Freq: Every day | ORAL | Status: DC
  Administered 2016-03-13 – 2016-03-14 (×3): 1 via ORAL
  Filled 2016-03-12 (×5): qty 1

## 2016-03-12 MED ORDER — NALOXONE HCL 0.4 MG/ML IJ SOLN: 0.4000 mg | INTRAMUSCULAR | Status: DC | PRN

## 2016-03-12 MED ORDER — MEPERIDINE HCL 25 MG/ML IJ SOLN
INTRAMUSCULAR | Status: AC
  Filled 2016-03-12: qty 1

## 2016-03-12 MED ORDER — LACTATED RINGERS IV SOLN
INTRAVENOUS | Status: DC | PRN
  Administered 2016-03-12: 40 [IU] via INTRAVENOUS

## 2016-03-12 MED ORDER — DIPHENHYDRAMINE HCL 25 MG PO CAPS
25.0000 mg | ORAL_CAPSULE | ORAL | Status: DC | PRN
  Filled 2016-03-12: qty 1

## 2016-03-12 MED ORDER — OXYCODONE HCL 5 MG PO TABS
5.0000 mg | ORAL_TABLET | ORAL | Status: DC | PRN
  Administered 2016-03-13 – 2016-03-15 (×7): 5 mg via ORAL
  Filled 2016-03-12 (×7): qty 1

## 2016-03-12 MED ORDER — ONDANSETRON HCL 4 MG/2ML IJ SOLN
INTRAMUSCULAR | Status: AC
  Filled 2016-03-12: qty 2

## 2016-03-12 MED ORDER — NALBUPHINE HCL 10 MG/ML IJ SOLN: 5.0000 mg | Freq: Once | INTRAMUSCULAR | Status: DC | PRN

## 2016-03-12 MED ORDER — COCONUT OIL OIL
1.0000 "application " | TOPICAL_OIL | Status: DC | PRN
  Filled 2016-03-12 (×2): qty 120

## 2016-03-12 MED ORDER — PHENYLEPHRINE HCL 10 MG/ML IJ SOLN
INTRAMUSCULAR | Status: DC | PRN
Start: 2016-03-12 — End: 2016-03-12
  Administered 2016-03-12 (×2): 40 ug via INTRAVENOUS

## 2016-03-12 MED ORDER — METHYLERGONOVINE MALEATE 0.2 MG/ML IJ SOLN
INTRAMUSCULAR | Status: DC | PRN
  Administered 2016-03-12: 0.2 mg via INTRAMUSCULAR

## 2016-03-12 MED ORDER — IBUPROFEN 600 MG PO TABS
600.0000 mg | ORAL_TABLET | Freq: Four times a day (QID) | ORAL | Status: DC
  Administered 2016-03-12 – 2016-03-15 (×9): 600 mg via ORAL
  Filled 2016-03-12 (×12): qty 1

## 2016-03-12 MED ORDER — LACTATED RINGERS IV SOLN
INTRAVENOUS | Status: DC | PRN
  Administered 2016-03-12: 01:00:00 via INTRAVENOUS

## 2016-03-12 MED ORDER — KETOROLAC TROMETHAMINE 30 MG/ML IJ SOLN
INTRAMUSCULAR | Status: AC
  Filled 2016-03-12: qty 1

## 2016-03-12 MED ORDER — DIBUCAINE 1 % RE OINT
1.0000 "application " | TOPICAL_OINTMENT | RECTAL | Status: DC | PRN
  Filled 2016-03-12: qty 28

## 2016-03-12 MED ORDER — DIPHENHYDRAMINE HCL 25 MG PO CAPS
25.0000 mg | ORAL_CAPSULE | Freq: Four times a day (QID) | ORAL | Status: DC | PRN
  Filled 2016-03-12: qty 1

## 2016-03-12 MED ORDER — SODIUM CHLORIDE 0.9 % IR SOLN
Status: DC | PRN
  Administered 2016-03-12: 1000 mL

## 2016-03-12 MED ORDER — MEPERIDINE HCL 25 MG/ML IJ SOLN
INTRAMUSCULAR | Status: DC | PRN
  Administered 2016-03-12 (×2): 12.5 mg via INTRAVENOUS

## 2016-03-12 MED ORDER — PROMETHAZINE HCL 25 MG/ML IJ SOLN: 6.2500 mg | INTRAMUSCULAR | Status: DC | PRN

## 2016-03-12 MED ORDER — DEXAMETHASONE SODIUM PHOSPHATE 4 MG/ML IJ SOLN
INTRAMUSCULAR | Status: AC
  Filled 2016-03-12: qty 1

## 2016-03-12 MED ORDER — FENTANYL CITRATE (PF) 100 MCG/2ML IJ SOLN
INTRAMUSCULAR | Status: AC
  Filled 2016-03-12: qty 2

## 2016-03-12 SURGICAL SUPPLY — 42 items
BARRIER ADHS 3X4 INTERCEED (GAUZE/BANDAGES/DRESSINGS) ×3 IMPLANT
BENZOIN TINCTURE PRP APPL 2/3 (GAUZE/BANDAGES/DRESSINGS) ×3 IMPLANT
CHLORAPREP W/TINT 26ML (MISCELLANEOUS) ×3 IMPLANT
CLAMP CORD UMBIL (MISCELLANEOUS) IMPLANT
CLOSURE STERI STRIP 1/2 X4 (GAUZE/BANDAGES/DRESSINGS) ×2 IMPLANT
CLOSURE WOUND 1/2 X4 (GAUZE/BANDAGES/DRESSINGS) ×1
CLOTH BEACON ORANGE TIMEOUT ST (SAFETY) ×3 IMPLANT
DERMABOND ADVANCED (GAUZE/BANDAGES/DRESSINGS)
DERMABOND ADVANCED .7 DNX12 (GAUZE/BANDAGES/DRESSINGS) IMPLANT
DRSG OPSITE POSTOP 4X10 (GAUZE/BANDAGES/DRESSINGS) ×3 IMPLANT
ELECT REM PT RETURN 9FT ADLT (ELECTROSURGICAL) ×3
ELECTRODE REM PT RTRN 9FT ADLT (ELECTROSURGICAL) ×1 IMPLANT
EXTRACTOR VACUUM KIWI (MISCELLANEOUS) IMPLANT
GLOVE BIOGEL PI IND STRL 6.5 (GLOVE) ×1 IMPLANT
GLOVE BIOGEL PI IND STRL 7.0 (GLOVE) ×2 IMPLANT
GLOVE BIOGEL PI INDICATOR 6.5 (GLOVE) ×2
GLOVE BIOGEL PI INDICATOR 7.0 (GLOVE) ×4
GLOVE ECLIPSE 6.5 STRL STRAW (GLOVE) ×3 IMPLANT
GOWN STRL REUS W/TWL LRG LVL3 (GOWN DISPOSABLE) ×9 IMPLANT
KIT ABG SYR 3ML LUER SLIP (SYRINGE) IMPLANT
NEEDLE HYPO 25X5/8 SAFETYGLIDE (NEEDLE) IMPLANT
NS IRRIG 1000ML POUR BTL (IV SOLUTION) ×3 IMPLANT
PACK C SECTION WH (CUSTOM PROCEDURE TRAY) ×3 IMPLANT
PAD ABD 7.5X8 STRL (GAUZE/BANDAGES/DRESSINGS) ×3 IMPLANT
PAD OB MATERNITY 4.3X12.25 (PERSONAL CARE ITEMS) ×3 IMPLANT
PENCIL SMOKE EVAC W/HOLSTER (ELECTROSURGICAL) ×3 IMPLANT
RETRACTOR WND ALEXIS 25 LRG (MISCELLANEOUS) ×1 IMPLANT
RTRCTR C-SECT PINK 25CM LRG (MISCELLANEOUS) ×3 IMPLANT
RTRCTR WOUND ALEXIS 25CM LRG (MISCELLANEOUS) ×3
SPONGE LAP 18X18 X RAY DECT (DISPOSABLE) ×3 IMPLANT
STRIP CLOSURE SKIN 1/2X4 (GAUZE/BANDAGES/DRESSINGS) ×2 IMPLANT
SUT PLAIN 0 NONE (SUTURE) IMPLANT
SUT PLAIN 2 0 XLH (SUTURE) IMPLANT
SUT VIC AB 0 CT1 27 (SUTURE) ×4
SUT VIC AB 0 CT1 27XBRD ANBCTR (SUTURE) ×2 IMPLANT
SUT VIC AB 0 CTX 36 (SUTURE) ×6
SUT VIC AB 0 CTX36XBRD ANBCTRL (SUTURE) ×3 IMPLANT
SUT VIC AB 2-0 CT1 27 (SUTURE) ×2
SUT VIC AB 2-0 CT1 TAPERPNT 27 (SUTURE) ×1 IMPLANT
SUT VIC AB 4-0 KS 27 (SUTURE) ×3 IMPLANT
TOWEL OR 17X24 6PK STRL BLUE (TOWEL DISPOSABLE) ×3 IMPLANT
TRAY FOLEY CATH SILVER 14FR (SET/KITS/TRAYS/PACK) IMPLANT

## 2016-03-12 NOTE — Consult Note (Signed)
Neonatology Note:   Attendance at C-section:    I was asked by Dr. Ozan to attend this primary C/S at term due to intolerance to labor. The mother is a G1, GBS neg with good prenatal care. ROM 9 hours before delivery, fluid clear. Infant vigorous with good spontaneous cry and tone. Needed only minimal bulb suctioning. Ap 8/9. Lungs clear to ausc in DR. To CN to care of Pediatrician.  Martha Wheat C. Nosson Wender, MD 

## 2016-03-12 NOTE — Lactation Note (Signed)
This note was copied from a baby's chart. Lactation Consultation Note: Initial visit baby now 10 hours old. Mom reports he nursed well after delivery. Last feeding about 5 am- has been sleepy since. Suggested unwrapping baby and attempting to latch. Mom agreeable. Baby spit up small amount of mucous. Would not latch going off to sleep. Reviewed hand expression with mom. Reviewed normal newborn behavior with mom. Discussed feeding cues and encouraged to feed whenever she sees them, Baby left skin to skin with mom. BF brochure given. Reviewed our phone number, OP appointments and BFSG as resources for support after DC. Asking about pumping and going back to work. Reviewed with mom. No further questions at present. To call for assist prn Patient Name: Martha Mccormick'UToday's Date: 03/12/2016 Reason for consult: Initial assessment   Maternal Data Formula Feeding for Exclusion: No Has patient been taught Hand Expression?: Yes Does the patient have breastfeeding experience prior to this delivery?: No  Feeding Feeding Type: Breast Fed  LATCH Score/Interventions Latch: Too sleepy or reluctant, no latch achieved, no sucking elicited.  Audible Swallowing: None  Type of Nipple: Everted at rest and after stimulation  Comfort (Breast/Nipple): Soft / non-tender     Hold (Positioning): Assistance needed to correctly position infant at breast and maintain latch.  LATCH Score: 5  Lactation Tools Discussed/Used     Consult Status Consult Status: Follow-up Date: 03/13/16 Follow-up type: In-patient    Pamelia HoitWeeks, Oluwadara Gorman D 03/12/2016, 11:00 AM

## 2016-03-12 NOTE — Anesthesia Postprocedure Evaluation (Signed)
Anesthesia Post Note  Patient: Martha Mccormick  Procedure(s) Performed: Procedure(s) (LRB): CESAREAN SECTION (N/A)  Anesthesia Type: Epidural Level of consciousness: awake Pain management: pain level controlled Vital Signs Assessment: post-procedure vital signs reviewed and stable Respiratory status: spontaneous breathing Cardiovascular status: stable Postop Assessment: no headache, no backache, epidural receding, patient able to bend at knees and no signs of nausea or vomiting Anesthetic complications: no        Last Vitals:  Vitals:   03/12/16 0200 03/12/16 0205  BP: 106/76 107/78  Pulse: (!) 58 (!) 58  Resp: 13 19  Temp: 36.4 C     Last Pain:  Vitals:   03/12/16 0200  TempSrc:   PainSc: 0-No pain   Pain Goal:                 Rozell Theiler JR,JOHN Paras Kreider

## 2016-03-12 NOTE — Addendum Note (Signed)
Addendum  created 03/12/16 0847 by Yolonda KidaAlison L Koa Palla, CRNA   Sign clinical note

## 2016-03-12 NOTE — Progress Notes (Signed)
Recovery Nurse Rolene Arbouresmaine Deklan Minar, RN

## 2016-03-12 NOTE — Progress Notes (Signed)
Postoperative Note Day # 0  S:  Patient resting comfortable in bed.  Pain controlled.  Tolerating clears. No flatus, no BM.  Lochia appropriate.  Not yet ambulated.  She denies n/v/f/c, SOB, or CP.  Pt plans on breastfeeding.  O: Temp:  [97.6 F (36.4 C)-98.4 F (36.9 C)] 98.1 F (36.7 C) (02/02 0559) Pulse Rate:  [44-78] 51 (02/02 0559) Resp:  [11-20] 16 (02/02 0559) BP: (96-129)/(26-89) 114/73 (02/02 0559) SpO2:  [95 %-100 %] 97 % (02/02 0559)  UOP: 1525cc/8hr  Gen: A&Ox3, NAD CV: RRR, no MRG Resp: CTAB Abdomen: soft, NT, ND +BS Uterus: firm, non-tender, below umbilicus Incision: c/d/i, bandage on Ext: No edema, no calf tenderness bilaterally, SCDs in place  Labs: to be drawn tomorrow  A/P: Pt is a 31 y.o. G1P1001 s/p primary C-section for fetal intolerance of labor, POD#0  - Pain well controlled -GU: UOP is adequate, foley to be removed once pt is ambulating -GI: Advance diet as tolerated -Activity: encouraged sitting up to chair and ambulation as tolerated -Prophylaxis: SCDs while in bed, early ambulation -PPH due to atony, EBL 1200cc, s/p methergine x1, CBC pending for tomorrow or if clinically indicated.  Iron twice daily.  Myna HidalgoJennifer Kimberley Dastrup, DO [phone] (pager) [phone] (office)

## 2016-03-12 NOTE — Op Note (Signed)
PreOp Diagnosis: 1) Intrauterine pregnancy @ 5760w5d 2) Fetal intolerance to labor PostOp Diagnosis: same, asynclitic presentation Procedure: Primary LTCS Surgeon: Dr. Myna HidalgoJennifer Margaretmary Prisk Anesthesia: epidural Complications: none EBL: 120cc UOP: 300cc Fluids: 2700cc  Findings: Female infant from vertex,asynclitic presentation with loose nuchal cord.  Normal uterus, tubes and ovaries bilaterally.  PROCEDURE:  Informed consent was obtained from the patient with risks, benefits, complications, treatment options, and expected outcomes discussed with the patient.  The patient concurred with the proposed plan, giving informed consent with form signed.   The patient was taken to Operating Room, and identified with the procedure verified as C-Section Delivery with Time Out. With induction of anesthesia, the patient was prepped and draped in the usual sterile fashion. A Pfannenstiel incision was made and carried down through the subcutaneous tissue to the fascia. The fascia was incised in the midline and extended transversely. The superior aspect of the fascial incision was grasped with Kochers elevated and the underlying muscle dissected off. The inferior aspect of the facial incision was in similar fashion, grasped elevated and rectus muscles dissected off. The peritoneum was identified and entered. Peritoneal incision was extended longitudinally. The utero-vesical peritoneal reflection was identified and incised transversely with the [facility] scissors, the incision extended laterally, the bladder flap created digitally. A low transverse uterine incision was made and the infants head delivered atraumatically. After the umbilical cord was clamped and cut cord blood was obtained for evaluation.   The placenta was removed intact and appeared normal. The uterine outline, tubes and ovaries appeared normal. The uterine incision was closed with running locked sutures of 0 Vicryl.  Significant bleeding noted along the  hysterotomy. A second layer of the same stitch was used in an imbricating fashion as well as figure of eight stitches to obtain hemostasis.  Excellent hemostasis was obtained.  The pericolic gutters were then cleared of all clots and debris. Interceed was placed over the incision. The fascia was then reapproximated with running sutures of 0 Vicryl. The peritoneum was closed in a running fashion.  The skin was closed with 4-0 vicryl in a subcuticular fashion.  Instrument, sponge, and needle counts were correct prior the abdominal closure and at the conclusion of the case. The patient was taken to recovery in stable condition.  Myna HidalgoJennifer Frenchie Pribyl, DO [phone] (pager) [phone] (office)

## 2016-03-12 NOTE — Progress Notes (Signed)
OB PN:  S: At bedside to evaluate FHT.  Pt resting comfortably reporting no complaints.  O: BP 113/73   Pulse (!) 52   Temp 98 F (36.7 C)   Resp 16   Ht 5\' 4"  (1.626 m)   Wt 152 lb (68.9 kg)   LMP 06/01/2015   SpO2 98%   BMI 26.09 kg/m   FHT: 120bpm, moderate variablity, + accels, late decels noted Toco: q1-503min SVE: 5/70/-2,unchanged with cervical swelling appreciated  A/P: 31 y.o. G1P0 @ 9574w4d for IOL 1. FWB: Cat. II- once Pitocin turned off late decelerations improve 2. Labor: Unfortunately discussed with patient that each time Pitocin is started [facility] become non-reassuring.  Pt has made no further cervical change since 1630 as baby as intolerant to labor.  Reviewed options of continued induction vs C-section.  Pt wishes to proceed with C-section for fetal intolerance to labor. Risk benefits and alternatives of cesarean section were discussed with the patient including but not limited to infection, bleeding, damage to bowel , bladder and baby with the need for further surgery. Pt voiced understanding and desires to proceed.  Pain: continue epidural GBS: negative  Myna HidalgoJennifer Aynslee Mulhall, DO [phone] (pager) [phone] (office)

## 2016-03-12 NOTE — Progress Notes (Signed)
Per Dr Charlotta Newtonzan turn HR monitor "low limit" to 35 since pt has low resting HR.

## 2016-03-12 NOTE — Transfer of Care (Signed)
Immediate Anesthesia Transfer of Care Note  Patient: Martha Mccormick  Procedure(s) Performed: Procedure(s): CESAREAN SECTION (N/A)  Patient Location: PACU  Anesthesia Type:Epidural  Level of Consciousness: awake, alert  and oriented  Airway & Oxygen Therapy: Patient Spontanous Breathing  Post-op Assessment: Report given to RN and Post -op Vital signs reviewed and stable  Post vital signs: Reviewed and stable  Last Vitals:  Vitals:   03/11/16 2331 03/12/16 0001  BP: 113/73   Pulse: (!) 52   Resp: 16   Temp:  36.8 C    Last Pain:  Vitals:   03/11/16 1931  TempSrc: Oral  PainSc:          Complications: No apparent anesthesia complications

## 2016-03-12 NOTE — Anesthesia Postprocedure Evaluation (Addendum)
Anesthesia Post Note  Patient: Martha Mccormick  Procedure(s) Performed: Procedure(s) (LRB): CESAREAN SECTION (N/A)  Patient location during evaluation: Mother Baby Anesthesia Type: Epidural Level of consciousness: awake, awake and alert, oriented and patient cooperative Pain management: pain level controlled Vital Signs Assessment: post-procedure vital signs reviewed and stable Respiratory status: spontaneous breathing, nonlabored ventilation and respiratory function stable Cardiovascular status: stable Postop Assessment: no headache, no backache, patient able to bend at knees and no signs of nausea or vomiting Anesthetic complications: no        Last Vitals:  Vitals:   03/12/16 0559 03/12/16 0731  BP: 114/73 114/66  Pulse: (!) 51 (!) 55  Resp: 16 16  Temp: 36.7 C 37.1 C    Last Pain:  Vitals:   03/12/16 0731  TempSrc: Oral  PainSc: 2    Pain Goal:                 CARVER,ALISON L

## 2016-03-13 NOTE — Lactation Note (Signed)
This note was copied from a baby's chart. Lactation Consultation Note  Patient Name: Martha Mccormick UJWJX'BToday's Date: 03/13/2016 Reason for consult: Follow-up assessment;Breast/nipple pain   Follow up with mom of 41 hour old infant. Infant with 9 BF for 10-50 minutes, 1 attempt, 1 void and 4 stool in 24 hours preceding this assessment. Infant weight 6 lb 8.1 oz with weight loss of 4% since birth. LATCH Scores 4-7.  Mom reports infant fed well at 4 pm feeding. She reports she is having some pain intermittent throughout the feeding. She reports infant has difficulty opening mouth. Mom reports she has been able to express some colostrum, she is not sure she is feeling any fuller today.   Mom would like LC assistance with next feeding, LC phone # left for mom to call when infant awakens to feed.    Maternal Data Formula Feeding for Exclusion: No Has patient been taught Hand Expression?: Yes Does the patient have breastfeeding experience prior to this delivery?: No  Feeding Feeding Type: Breast Fed Length of feed: 50 min  LATCH Score/Interventions Latch: Grasps breast easily, tongue down, lips flanged, rhythmical sucking. Intervention(s): Teach feeding cues;Skin to skin;Waking techniques Intervention(s): Adjust position;Assist with latch  Audible Swallowing: A few with stimulation Intervention(s): Skin to skin  Type of Nipple: Everted at rest and after stimulation  Comfort (Breast/Nipple): Filling, red/small blisters or bruises, mild/mod discomfort  Problem noted: Mild/Moderate discomfort Interventions (Mild/moderate discomfort): Hand massage;Pre-pump if needed  Hold (Positioning): Assistance needed to correctly position infant at breast and maintain latch.  LATCH Score: 7  Lactation Tools Discussed/Used     Consult Status Consult Status: Follow-up Date: 03/13/16 (when mom calls for assistance) Follow-up type: In-patient    Martha Mccormick 03/13/2016, 6:06 PM

## 2016-03-13 NOTE — Progress Notes (Signed)
Assisted with helping baby to latch several times, baby latches but is not getting enough of the breast in his mouth and this causes pain for the MOB. I encourage MOB to hand express into a spoon and feed baby, I also encouraged MOB to DEBP. MOB expressed that she would like to speak with LC about the DEBP, before beginning that process. I set the equipment in the room and explained to MOB how to use the DEBP in the event she chooses to do so.

## 2016-03-13 NOTE — Progress Notes (Signed)
Postoperative Note Day # 1  S:  Patient resting comfortable in bed.  Pain controlled.  Tolerating clears. No flatus, no BM.  Lochia appropriate.  Ambulating and voiding without difficulty.  She denies n/v/f/c, SOB, or CP.  Pt plans on breastfeeding.  O: Temp:  [97.9 F (36.6 C)-98.3 F (36.8 C)] 98.2 F (36.8 C) (02/03 0601) Pulse Rate:  [50-58] 51 (02/03 0601) Resp:  [16-18] 18 (02/03 0601) BP: (84-102)/(44-54) 87/47 (02/03 0601) SpO2:  [95 %-98 %] 98 % (02/03 0201)    Gen: A&Ox3, NAD CV: RRR, no MRG Resp: CTAB Abdomen: soft, NT, ND +BS Uterus: firm, non-tender, below umbilicus Incision: c/d/i, bandage on Ext: No edema, no calf tenderness bilaterally, SCDs in place  Labs:  CBC Latest Ref Rng & Units 03/12/2016 03/11/2016 03/02/2016  WBC 4.0 - 10.5 K/uL 17.4(H) 10.5 8.8  Hemoglobin 12.0 - 15.0 g/dL 10.2(L) 12.9 13.1  Hematocrit 36.0 - 46.0 % 29.1(L) 36.2 37.4  Platelets 150 - 400 K/uL 166 210 184     A/P: Pt is a 31 y.o. G1P1001 s/p primary C-section for fetal intolerance of labor, POD#1  - Pain well controlled -GU: UOP is adequate, foley to be removed once pt is ambulating -GI: Advance diet as tolerated -Activity: encouraged sitting up to chair and ambulation as tolerated -Prophylaxis: SCDs while in bed, early ambulation -PPH due to atony, EBL 1200cc, s/p methergine x1, CBC appropriate, pt asymptomatic.  Continue with iron twice daily. -Baby boy circ completed  CCOB covering through the weekend  Myna HidalgoJennifer Lasonia Casino, DO [phone] (pager) [phone] (office)

## 2016-03-13 NOTE — Lactation Note (Signed)
This note was copied from a baby's chart. Lactation Consultation Note  Patient Name: Martha Mccormick Marshallnna Demonbreun JYNWG'NToday's Date: 03/13/2016 Reason for consult: Follow-up assessment;Breast/nipple pain   Called to mom's room for feeding assistance. Mom's nipples noted to be pink and bruised with right being more bruised than left. Mom latched infant to left breast in the football hold. She notes that nipple pain/pinching is persistent throughout feeding. Nipple is pinched and blanched when infant comes off despite corrective measures and postioning.   Applied # 20 NS, pinching remained. Applied # 24 NS and mom reports feels much better. Colostrum was noted in NS when infant came off and nipple was more rounded. Discussed with mom that NS is barrier and recommended she post pump 4-6 x a day for 15 minutes on Initiate setting. DEBP set up with instructions for use on Initiate setting, assembling, disassembling and cleaning of pump parts. Discussed that if EBM is obtained to prime NS with it, or feed with finger and curved tip syringe.   Comfort gels given with instructions for use and cleaning. Enc mom to apply EBM prior to Comfort gels. Mom and dad without further questions at this time. Report to Darel HongJudy, Charity fundraiserN.    Maternal Data Formula Feeding for Exclusion: No Has patient been taught Hand Expression?: Yes Does the patient have breastfeeding experience prior to this delivery?: No  Feeding Feeding Type: Breast Fed Length of feed: 15 min  LATCH Score/Interventions Latch: Grasps breast easily, tongue down, lips flanged, rhythmical sucking. Intervention(s): Skin to skin;Teach feeding cues;Waking techniques Intervention(s): Adjust position;Assist with latch;Breast massage;Breast compression  Audible Swallowing: Spontaneous and intermittent Intervention(s): Alternate breast massage  Type of Nipple: Everted at rest and after stimulation  Comfort (Breast/Nipple): Filling, red/small blisters or bruises, mild/mod  discomfort  Problem noted: Cracked, bleeding, blisters, bruises;Mild/Moderate discomfort Interventions  (Cracked/bleeding/bruising/blister): Expressed breast milk to nipple Interventions (Mild/moderate discomfort): Comfort gels;Hand expression  Hold (Positioning): Assistance needed to correctly position infant at breast and maintain latch. Intervention(s): Breastfeeding basics reviewed;Support Pillows;Position options;Skin to skin  LATCH Score: 8  Lactation Tools Discussed/Used Tools: Comfort gels;Pump;Nipple Shields Nipple shield size: 24 Breast pump type: Double-Electric Breast Pump WIC Program: No Pump Review: Setup, frequency, and cleaning;Milk Storage Initiated by:: Noralee StainSharon Graydon Fofana, RN, IBCLC Date initiated:: 03/13/16   Consult Status Consult Status: Follow-up Date: 03/14/16 Follow-up type: In-patient    Silas FloodSharon S Tenna Lacko 03/13/2016, 9:10 PM

## 2016-03-14 NOTE — Lactation Note (Signed)
This note was copied from a baby's chart. Lactation Consultation Note  Patient Name: Martha Mccormick RUEAV'WToday's Date: 03/14/2016 Reason for consult: Follow-up assessment;Breast/nipple pain;Infant weight loss   Follow up with mom of 63 hour old infant. Infant asleep in crib. Mom reports BF is still painful but infant is doing much better. Mom reports she comfort gels are helping her nipples. Mom reports infant is feeding at the breast using # 24 NS and then they are offering supplement via bottle. Mom reports she is getting more EBM today and is getting enough to feed infant supplement. Mom reports infant voided recently and is more content once supplement started. Mom without questions/concerns at this time. She declined need for [facility] assistance at this time. Enc her to call out with questions/concerns prn.    Maternal Data Formula Feeding for Exclusion: No Has patient been taught Hand Expression?: Yes  Feeding    LATCH Score/Interventions                      Lactation Tools Discussed/Used Pump Review: Setup, frequency, and cleaning Initiated by:: Reviewed   Consult Status Consult Status: Follow-up Date: 03/15/16 Follow-up type: In-patient    Martha Mccormick 03/14/2016, 4:15 PM

## 2016-03-14 NOTE — Progress Notes (Signed)
Subjective: Postpartum Day 2: Cesarean Delivery primary for fetal intolerance of labor Patient reports tolerating PO, + flatus and no problems voiding.  Ambulating in halls, pain controlled.  Would like to stay till tomorrow.  Objective: Vital signs in last 24 hours: Temp:  [98 F (36.7 C)-98.2 F (36.8 C)] 98 F (36.7 C) (02/04 0540) Pulse Rate:  [57] 57 (02/03 1740) Resp:  [18] 18 (02/04 0540) BP: (108)/(55-64) 108/64 (02/04 0540)  Physical Exam:  General: alert, cooperative and no distress Lochia: appropriate Uterine Fundus: firm Incision: healing well DVT Evaluation: No evidence of DVT seen on physical exam. Negative Homan's sign.   Recent Labs  03/12/16 0649  HGB 10.2*  HCT 29.1*    Assessment/Plan: Status post Cesarean section. Doing well postoperatively.  Continue current care.  Henderson Newcomerancy Jean Prothero 03/14/2016, 11:25 AM

## 2016-03-15 MED ORDER — OXYCODONE HCL 5 MG PO TABS: 5.0000 mg | ORAL_TABLET | Freq: Four times a day (QID) | ORAL | 0 refills | Status: DC | PRN

## 2016-03-15 MED ORDER — IBUPROFEN 600 MG PO TABS: 600.0000 mg | ORAL_TABLET | Freq: Four times a day (QID) | ORAL | 0 refills | Status: DC

## 2016-03-15 MED ORDER — DOCUSATE SODIUM 100 MG PO CAPS: 100.0000 mg | ORAL_CAPSULE | Freq: Two times a day (BID) | ORAL | 0 refills | Status: DC

## 2016-03-15 MED ORDER — FERROUS SULFATE 325 (65 FE) MG PO TABS: 325.0000 mg | ORAL_TABLET | Freq: Two times a day (BID) | ORAL | 3 refills | Status: DC

## 2016-03-15 NOTE — Discharge Instructions (Signed)
Cesarean Delivery, Care After Refer to this sheet in the next few weeks. These instructions provide you with information about caring for yourself after your procedure. Your health care provider may also give you more specific instructions. Your treatment has been planned according to current medical practices, but problems sometimes occur. Call your health care provider if you have any problems or questions after your procedure. What can I expect after the procedure? After the procedure, it is common to have:  A small amount of blood or clear fluid coming from the incision.  Some redness, swelling, and pain in your incision area.  Some abdominal pain and soreness.  Vaginal bleeding (lochia).  Pelvic cramps.  Fatigue. Follow these instructions at home: Incision care  Follow instructions from your health care provider about how to take care of your incision. Make sure you:  Wash your hands with soap and water before you change your bandage (dressing). If soap and water are not available, use hand sanitizer.  Change your dressing as told by your health care provider.  Leave stitches (sutures), skin staples, skin glue, or adhesive strips in place. These skin closures may need to stay in place for 2 weeks or longer. If adhesive strip edges start to loosen and curl up, you may trim the loose edges. Do not remove adhesive strips completely unless your health care provider tells you to do that.  Check your incision area every day for signs of infection. Check for:  More redness, swelling, or pain.  More fluid or blood.  Warmth.  Pus or a bad smell.  When you cough or sneeze, hug a pillow. This helps with pain and decreases the chance of your incision opening up (dehiscing). Do this until your incision heals.   Medicines    Take over-the-counter and prescription medicines only as told by your health care provider.  Alternate between the Percocet and Motrin as needed.  If the  percocet seems too strong, take Tylenol instead and continue with the Ibuprofen.  Percocet may cause constipation, continue Colace twice daily.  If you were prescribed an antibiotic medicine, take it as told by your health care provider. Do not stop taking the antibiotic until it is finished. Driving  Do not drive or operate heavy machinery while taking prescription pain medicine.  Do not drive for 24 hours if you received a sedative. Lifestyle  Do not drink alcohol. This is especially important if you are breastfeeding or taking pain medicine.  Do not use tobacco products, including cigarettes, chewing tobacco, or e-cigarettes. If you need help quitting, ask your health care provider. Tobacco can delay wound healing. Eating and drinking  Drink at least 8 eight-ounce glasses of water every day unless told not to by your health care provider. If you breastfeed, you may need to drink more water than this.  Eat high-fiber foods every day. These foods may help prevent or relieve constipation. High-fiber foods include:  Whole grain cereals and breads.  Brown rice.  Beans.  Fresh fruits and vegetables. Activity  Return to your normal activities as told by your health care provider. Ask your health care provider what activities are safe for you.  Rest as much as possible. Try to rest or take a nap while your baby is sleeping.  Do not lift anything that is heavier than your baby or 10 lb (4.5 kg) as told by your health care provider.  Ask your health care provider when you can engage in sexual activity. This may  depend on your:  Risk of infection.  Healing rate.  Comfort and desire to engage in sexual activity. Bathing  Do not take baths, swim, or use a hot tub until your health care provider approves. Ask your health care provider if you can take showers. You may only be allowed to take sponge baths until your incision heals.  Keep your dressing dry as told by your health care  provider. General instructions  Do not use tampons or douches until your health care provider approves.  Wear:  Loose, comfortable clothing.  A supportive and well-fitting bra.  Watch for any blood clots that may pass from your vagina. These may look like clumps of dark red, brown, or black discharge.  Keep your perineum clean and dry as told by your health care provider.  Wipe from front to back when you use the toilet.  If possible, have someone help you care for your baby and help with household activities for a few days after you leave the hospital.  Keep all follow-up visits for you and your baby as told by your health care provider. This is important. Contact a health care provider if:  You have:  Bad-smelling vaginal discharge.  Difficulty urinating.  Pain when urinating.  A sudden increase or decrease in the frequency of your bowel movements.  More redness, swelling, or pain around your incision.  More fluid or blood coming from your incision.  Pus or a bad smell coming from your incision.  A fever.  A rash.  Little or no interest in activities you used to enjoy.  Questions about caring for yourself or your baby.  Nausea.  Your incision feels warm to the touch.  Your breasts turn red or become painful or hard.  You feel unusually sad or worried.  You vomit.  You pass large blood clots from your vagina. If you pass a blood clot, save it to show to your health care provider. Do not flush blood clots down the toilet without showing your health care provider.  You urinate more than usual.  You are dizzy or light-headed.  You have not breastfed and have not had a menstrual period for 12 weeks after delivery.  You stopped breastfeeding and have not had a menstrual period for 12 weeks after stopping breastfeeding. Get help right away if:  You have:  Pain that does not go away or get better with medicine.  Chest pain.  Difficulty  breathing.  Blurred vision or spots in your vision.  Thoughts about hurting yourself or your baby.  New pain in your abdomen or in one of your legs.  A severe headache.  You faint.  You bleed from your vagina so much that you fill two sanitary pads in one hour. This information is not intended to replace advice given to you by your health care provider. Make sure you discuss any questions you have with your health care provider. Document Released: 10/17/2001 Document Revised: 06/05/2015 Document Reviewed: 12/30/2014 Elsevier Interactive Patient Education  2017 [employer].

## 2016-03-15 NOTE — Lactation Note (Signed)
This note was copied from a baby's chart. Lactation Consultation Note: Mother and baby have discharge order. Nurse states that mother has lots of questions.  Mother reports that her milk is coming in and her breast are very full.   She has questions about pumping, how often and how long . Mother has appox 60 ml of ebm at the bedside.   Mothers breast are firm and full. Mother taught to massage breast for 5-10 mins to soften tissue and aid in better milk flow. Suggested that mother pre pump for 2-3 mins to soften areola.  Mother has been using a #24 nipple shield which she states that the nipple hurts at first but get better. She doesn't see any milk in the shield when infant comes off .   Mother wants assistance with latching infant without the nipple shield.  Assist mother with latching infant on the bare breast / football hold/ right breast.  Assist with latching infant in off sided latch. Infant latched on . Mother and father taught to flange infants lips for wider gape. Infant began suckling with audible swallows. LC massaged mothers breast while mother did breast compression. Infant fed for 15 mins. Advised mother to [place] infant after infant showing non- nurtrtive suckling. Mothers nipple slightly pinched. Advised  Mother to rotate positions frequent.   Reviewed plan of care with parents. Mother to breastfeed infant today without nipple shield. Only use shield if unable to latch to bare breast.  Supplement infant with ebm . Mother to pump after feeding /use good breast massage/ice for 15 mins every 3-4 hours Mother was scheduled for an out patient visit on Fed 8 at 2;30. She is aware she can call LC line at any time.   Patient Name: Boy Horton Marshallnna Horen RUEAV'WToday's Date: 03/15/2016 Reason for consult: Follow-up assessment   Maternal Data    Feeding Feeding Type: Breast Fed Length of feed: 15 min (latch without the nipple shield. )  LATCH Score/Interventions Latch: Grasps breast easily,  tongue down, lips flanged, rhythmical sucking. Intervention(s): Waking techniques Intervention(s): Adjust position;Assist with latch;Breast massage;Breast compression  Audible Swallowing: Spontaneous and intermittent Intervention(s): Hand expression  Type of Nipple: Everted at rest and after stimulation  Comfort (Breast/Nipple): Filling, red/small blisters or bruises, mild/mod discomfort  Problem noted: Filling Interventions (Filling): Reverse pressure;Double electric pump Interventions (Mild/moderate discomfort): Hand expression;Reverse pressue;Comfort gels  Hold (Positioning): Assistance needed to correctly position infant at breast and maintain latch. Intervention(s): Support Pillows;Position options  LATCH Score: 8  Lactation Tools Discussed/Used     Consult Status Consult Status: Follow-up Date: 03/18/16 Follow-up type: Out-patient    Stevan BornKendrick, Tenesha Garza [facility] 03/15/2016, 9:54 AM

## 2016-03-15 NOTE — Progress Notes (Signed)
Postoperative Note Day # 3  S:  Patient resting comfortable in bed.  Pain controlled.  Tolerating clears. + flatus, no BM.  Lochia appropriate.  Ambulating and voiding without difficulty.  She denies n/v/f/c, SOB, or CP.  Pt plans on breastfeeding though having some difficulty with latching.  O: Temp:  [97.9 F (36.6 C)] 97.9 F (36.6 C) (02/04 1800) Pulse Rate:  [52-69] 52 (02/05 0639) Resp:  [18-19] 18 (02/05 0639) BP: (111-112)/(64-68) 111/64 (02/05 [id number])    Gen: A&Ox3, NAD CV: RRR Resp: CTAB Abdomen: soft, NT, ND Uterus: firm, non-tender, below umbilicus Incision: c/d/i, bandage on Ext: No edema, no calf tenderness bilaterally, SCDs in place  Labs:  CBC Latest Ref Rng & Units 03/12/2016 03/11/2016 03/02/2016  WBC 4.0 - 10.5 K/uL 17.4(H) 10.5 8.8  Hemoglobin 12.0 - 15.0 g/dL 10.2(L) 12.9 13.1  Hematocrit 36.0 - 46.0 % 29.1(L) 36.2 37.4  Platelets 150 - 400 K/uL 166 210 184     A/P: Pt is a 31 y.o. G1P1001 s/p primary C-section for fetal intolerance of labor, POD#3  - Pain well controlled -GU: Voiding freely -GI: Tolerating general diet -Activity: encouraged sitting up to chair and ambulation as tolerated -Prophylaxis: SCDs while in bed, early ambulation -PPH due to atony, EBL 1200cc, s/p methergine x1, CBC appropriate, pt asymptomatic.  Continue with iron twice daily. -Baby boy circ completed -Lactation to see pt prior to discharge today  Meeting postoperative milestones appropriately, plan for discharge home later today  Myna HidalgoJennifer Luismanuel Corman, DO [phone] (pager) [phone] (office)

## 2016-03-17 NOTE — Discharge Summary (Signed)
OB Discharge Summary     Patient Name: Martha Mccormick DOB: 11-04-1985 MRN: [id number]  Date of admission: 03/11/2016 Delivering MD: Myna HidalgoZAN, Silvia Markuson   Date of discharge: 03/15/2016  Admitting diagnosis: 5345w4d, Induction  Intrauterine pregnancy: 258w5d     Secondary diagnosis:  Principal Problem:   Status post primary low transverse cesarean section Active Problems:   PPH (postpartum hemorrhage)  Additional problems: none     Discharge diagnosis: Term Pregnancy Delivered and Anemia                                                                                                Post partum procedures:none  Augmentation: AROM, Pitocin, Cytotec and Foley Balloon  Complications: Hemorrhage>105300mL  Hospital course:  Induction of Labor With Cesarean Section  31 y.o. yo G1P1001 at 7758w5d was admitted to the hospital 03/11/2016 for induction of labor. Patient had a labor course significant for fetal intolerance to labor. She was induced with Cytotec then augmentation continued with Foley and Pitocin.  However, each time the Pit was started the baby was noted to have late decelerations and no further dilation past 4cm was appreciated.  The patient went for cesarean section due to Non-Reassuring FHR, and delivered a Viable infant.  Membrane Rupture Time/Date: )4:35 PM ,03/11/2016   @Details  of operation can be found in separate operative Note.  Patient had an uncomplicated postpartum course. She is ambulating, tolerating a regular diet, passing flatus, and urinating well.  Patient is discharged home in stable condition on 03/17/16.                                    Physical exam  Vitals:   03/13/16 1740 03/14/16 0540 03/14/16 1800 03/15/16 0639  BP: (!) 108/55 108/64 112/68 111/64  Pulse: (!) 57  69 (!) 52  Resp: 18 18 19 18   Temp: 98.2 F (36.8 C) 98 F (36.7 C) 97.9 F (36.6 C)   TempSrc: Oral Oral    SpO2:      Weight:      Height:       General: alert, cooperative and no  distress Lochia: appropriate Uterine Fundus: firm Incision: Dressing is clean, dry, and intact DVT Evaluation: No evidence of DVT seen on physical exam. Labs: Lab Results  Component Value Date   WBC 17.4 (H) 03/12/2016   HGB 10.2 (L) 03/12/2016   HCT 29.1 (L) 03/12/2016   MCV 87.9 03/12/2016   PLT 166 03/12/2016   No flowsheet data found.  Discharge instruction: per After Visit Summary and "Baby and Me Booklet".  After visit meds:  Allergies as of 03/15/2016   No Known Allergies     Medication List    STOP taking these medications   acetaminophen 500 MG tablet Commonly known as:  TYLENOL     TAKE these medications   docusate sodium 100 MG capsule Commonly known as:  COLACE Take 1 capsule (100 mg total) by mouth 2 (two) times daily.   ferrous sulfate 325 (65 FE) MG tablet  Take 1 tablet (325 mg total) by mouth 2 (two) times daily with a meal.   ibuprofen 600 MG tablet Commonly known as:  ADVIL,MOTRIN Take 1 tablet (600 mg total) by mouth every 6 (six) hours.   oxyCODONE 5 MG immediate release tablet Commonly known as:  Oxy IR/ROXICODONE Take 1 tablet (5 mg total) by mouth every 6 (six) hours as needed (pain scale 4-7).   prenatal vitamin w/FE, FA 27-1 MG Tabs tablet Take 1 tablet by mouth daily at 12 noon.       Diet: routine diet  Activity: Advance as tolerated. Pelvic rest for 6 weeks.   Outpatient follow up:2 weeks Follow up Appt:Future Appointments Date Time Provider Department Center  03/18/2016 2:30 PM [facility] CONSULTANT [facility] None   Follow up Visit:No Follow-up on file.  Postpartum contraception: Progesterone only pills  Newborn Data: Live born female  Birth Weight: 6 lb 13 oz (3090 g) APGAR: 8, 9  Baby Feeding: Breast Disposition:home with mother   03/17/2016 Myna Hidalgo, M, DO

## 2016-03-18 ENCOUNTER — Ambulatory Visit (HOSPITAL_COMMUNITY)
Admission: AD | Admit: 2016-03-18 | Discharge: 2016-03-18 | Disposition: A | Discharge status: Home / Self Care | Payer: No Typology Code available for payment source | Source: Ambulatory Visit | Attending: Obstetrics & Gynecology | Admitting: Obstetrics & Gynecology

## 2016-03-18 NOTE — Lactation Note (Signed)
Lactation Consult; Weight today 2992 g 6 #9.6 oz, Mom has sore nipples. She has been pumping and bottle feeding EBM, Is putting the baby to the breast once/day. She wants to latch baby today. Assisted mom with latch in football hold. I untucked his bottom lip and mom reports that feels better  Mom is pulling at the breast to check on his bottom lip. Encouraged to press on baby's chin but not to pull at breast. Bowen nursed for 20 min then relaxed and content. Did not latch to other breast. Encouraged mom to pump when she gets home.( Did not bring pump with her) Mom pleased he had done so well. Nipple slightly pinched when he came off the breast Asking about milk storage- reviewed with mom.  Encouragement given. Offered another OP appointment but mom wants to go home and see how things go,. Reviewed [facility] as another resource. No further questions at present. To call prn  Mother's reason for visit:  Sore nipples Visit Type:  Feeding assist Appointment Notes:  Using NS in hospital Consult:  Initial Lactation Consultant:  Pamelia HoitWeeks, Nayla Dias D  ________________________________________________________________________  Joan FloresBaby's Name:  Ophelia CharterBowen Loudon Mcguire Date of Birth:  03/12/2014 Pediatrician:  [facility] Gender:  female Gestational Age: 8250w5d (At Birth) Birth Weight:  6 lb 13 oz (3090 g) Weight at Discharge:  Weight: 6 lb 2.6 oz (2795 g)               Date of Discharge:  03/15/2016      Filed Weights   03/13/16 0031 03/13/16 2320 03/15/16 0000  Weight: 6 lb 8.1 oz (2951 g) 6 lb 5.1 oz (2865 g) 6 lb 2.6 oz (2795 g)     ________________________________________________________________________  Mother's Name: Wilfrid LundAnna G Umbach Type of delivery:  C/S Breastfeeding Experience:  p1  ________________________________________________________________________  Breastfeeding History (Post Discharge)  Frequency of breastfeeding:  1-2 times/day Duration of feeding:  30 min  Supplementation  Formula:  Volume 0  ml  Breastmilk:  Volume 40-70 ml Method:  Bottle,   Pumping  Type of pump:  Medela pump in style Frequency:  q feeding Volume:  60-120 ml  Infant Intake and Output Assessment  Voids:  7-10 in 24 hrs.  Color:  Clear yellow Stools:  8-9 in 24 hrs.  Color:  Yellow  ________________________________________________________________________  Maternal Breast Assessment  Breast:  Full Nipple:  Erect and Reddened  _______________________________________________________________________ Feeding Assessment/Evaluation  Initial feeding assessment:  Infant's oral assessment:  WNL  Positioning:  Football Right breast  LATCH documentation:  Latch:  2 = Grasps breast easily, tongue down, lips flanged, rhythmical sucking.  Audible swallowing:  2 = Spontaneous and intermittent  Type of nipple:  2 = Everted at rest and after stimulation  Comfort (Breast/Nipple):  1 = Filling, red/small blisters or bruises, mild/mod discomfort  Hold (Positioning):  1 = Assistance needed to correctly position infant at breast and maintain latch  LATCH score:  8  Attached assessment:  Deep  Lips flanged:  Yes.    Lips untucked:  Yes.    Suck assessment:  Nutritive   Pre-feed weight:  2992 g 6 # 9.6 oz Post-feed weight:  3050 g  6 # 11.6 oz Amount transferred:  58 ml Amount supplemented:  0 ml    Total amount pumped post feed:   Mom did not bring pump with her. Will pump when she gets home  Total amount transferred:  58 ml Total supplement given:  0 ml

## 2016-04-15 ENCOUNTER — Encounter (HOSPITAL_COMMUNITY): Payer: Self-pay

## 2016-07-16 NOTE — Addendum Note (Signed)
Addendum  created 07/16/16 0946 by Leilani AbleHatchett, Ryiah Bellissimo, MD   Sign clinical note

## 2018-06-06 LAB — OB RESULTS CONSOLE ANTIBODY SCREEN: Antibody Screen: NEGATIVE

## 2018-06-06 LAB — OB RESULTS CONSOLE HIV ANTIBODY (ROUTINE TESTING): HIV: NONREACTIVE

## 2018-06-06 LAB — OB RESULTS CONSOLE GC/CHLAMYDIA
Chlamydia: NEGATIVE
Gonorrhea: NEGATIVE

## 2018-06-06 LAB — OB RESULTS CONSOLE ABO/RH: RH Type: POSITIVE

## 2018-06-06 LAB — OB RESULTS CONSOLE HEPATITIS B SURFACE ANTIGEN: Hepatitis B Surface Ag: NEGATIVE

## 2018-06-06 LAB — OB RESULTS CONSOLE RUBELLA ANTIBODY, IGM: Rubella: IMMUNE

## 2018-06-06 LAB — OB RESULTS CONSOLE RPR: RPR: NONREACTIVE

## 2019-01-09 ENCOUNTER — Telehealth (HOSPITAL_COMMUNITY): Payer: Self-pay | Admitting: *Deleted

## 2019-01-09 ENCOUNTER — Encounter (HOSPITAL_COMMUNITY): Payer: Self-pay | Admitting: *Deleted

## 2019-01-09 ENCOUNTER — Other Ambulatory Visit (HOSPITAL_COMMUNITY)
Admission: RE | Admit: 2019-01-09 | Discharge: 2019-01-09 | Disposition: A | Discharge status: Home / Self Care | Payer: No Typology Code available for payment source | Source: Ambulatory Visit

## 2019-01-09 LAB — OB RESULTS CONSOLE GBS: GBS: POSITIVE

## 2019-01-09 NOTE — Telephone Encounter (Signed)
Preadmission screen  

## 2019-01-11 ENCOUNTER — Encounter (HOSPITAL_COMMUNITY): Admission: AD | Payer: Self-pay | Source: Home / Self Care

## 2019-01-11 ENCOUNTER — Encounter (HOSPITAL_COMMUNITY)
Admission: AD | Disposition: A | Discharge status: Home / Self Care | Payer: Self-pay | Source: Home / Self Care | Attending: Obstetrics & Gynecology

## 2019-01-11 ENCOUNTER — Inpatient Hospital Stay (HOSPITAL_COMMUNITY): Payer: No Typology Code available for payment source | Admitting: Anesthesiology

## 2019-01-11 ENCOUNTER — Encounter (HOSPITAL_COMMUNITY): Payer: Self-pay

## 2019-01-11 ENCOUNTER — Other Ambulatory Visit: Payer: Self-pay

## 2019-01-11 ENCOUNTER — Inpatient Hospital Stay (HOSPITAL_COMMUNITY)
Admission: AD | Admit: 2019-01-11 | Payer: No Typology Code available for payment source | Source: Home / Self Care | Admitting: Obstetrics & Gynecology

## 2019-01-11 ENCOUNTER — Inpatient Hospital Stay (HOSPITAL_COMMUNITY)
Admission: AD | Admit: 2019-01-11 | Discharge: 2019-01-13 | DRG: 788 | Disposition: A | Discharge status: Home / Self Care | Payer: No Typology Code available for payment source | Attending: Obstetrics & Gynecology | Admitting: Obstetrics & Gynecology

## 2019-01-11 DIAGNOSIS — Z98891 History of uterine scar from previous surgery: Secondary | ICD-10-CM

## 2019-01-11 DIAGNOSIS — Z20828 Contact with and (suspected) exposure to other viral communicable diseases: Secondary | ICD-10-CM | POA: Diagnosis present

## 2019-01-11 DIAGNOSIS — Z3A39 39 weeks gestation of pregnancy: Secondary | ICD-10-CM

## 2019-01-11 DIAGNOSIS — O99824 Streptococcus B carrier state complicating childbirth: Secondary | ICD-10-CM | POA: Diagnosis present

## 2019-01-11 DIAGNOSIS — O26893 Other specified pregnancy related conditions, third trimester: Secondary | ICD-10-CM | POA: Diagnosis present

## 2019-01-11 LAB — CBC
HCT: 38.7 % (ref 36.0–46.0)
Hemoglobin: 13.2 g/dL (ref 12.0–15.0)
MCH: 31.1 pg (ref 26.0–34.0)
MCHC: 34.1 g/dL (ref 30.0–36.0)
MCV: 91.1 fL (ref 80.0–100.0)
Platelets: 194 10*3/uL (ref 150–400)
RBC: 4.25 MIL/uL (ref 3.87–5.11)
RDW: 12.4 % (ref 11.5–15.5)
WBC: 14.4 10*3/uL — ABNORMAL HIGH (ref 4.0–10.5)
nRBC: 0 % (ref 0.0–0.2)

## 2019-01-11 LAB — TYPE AND SCREEN
ABO/RH(D): A POS
Antibody Screen: NEGATIVE

## 2019-01-11 LAB — SARS CORONAVIRUS 2 (TAT 6-24 HRS): SARS Coronavirus 2: NEGATIVE

## 2019-01-11 LAB — RPR: RPR Ser Ql: NONREACTIVE

## 2019-01-11 LAB — ABO/RH: ABO/RH(D): A POS

## 2019-01-11 LAB — POCT FERN TEST: POCT Fern Test: POSITIVE

## 2019-01-11 SURGERY — Surgical Case
Anesthesia: Spinal | Wound class: Clean Contaminated

## 2019-01-11 SURGERY — Surgical Case
Anesthesia: Regional

## 2019-01-11 MED ORDER — SIMETHICONE 80 MG PO CHEW
80.0000 mg | CHEWABLE_TABLET | Freq: Three times a day (TID) | ORAL | Status: DC
  Administered 2019-01-11 – 2019-01-13 (×5): 80 mg via ORAL
  Filled 2019-01-11 (×5): qty 1

## 2019-01-11 MED ORDER — PRENATAL MULTIVITAMIN CH
1.0000 | ORAL_TABLET | Freq: Every day | ORAL | Status: DC
  Administered 2019-01-11 – 2019-01-12 (×2): 1 via ORAL
  Filled 2019-01-11 (×2): qty 1

## 2019-01-11 MED ORDER — OXYTOCIN BOLUS FROM INFUSION: 500.0000 mL | Freq: Once | INTRAVENOUS | Status: DC

## 2019-01-11 MED ORDER — IBUPROFEN 600 MG PO TABS
600.0000 mg | ORAL_TABLET | Freq: Four times a day (QID) | ORAL | Status: DC | PRN
  Administered 2019-01-12 – 2019-01-13 (×3): 600 mg via ORAL
  Filled 2019-01-11 (×3): qty 1

## 2019-01-11 MED ORDER — NALBUPHINE SYRINGE 5 MG/0.5 ML
5.0000 mg | INJECTION | INTRAMUSCULAR | Status: DC | PRN
  Filled 2019-01-11: qty 0.5

## 2019-01-11 MED ORDER — MORPHINE SULFATE (PF) 0.5 MG/ML IJ SOLN
INTRAMUSCULAR | Status: DC | PRN
  Administered 2019-01-11: .15 mg via INTRATHECAL

## 2019-01-11 MED ORDER — PROMETHAZINE HCL 25 MG/ML IJ SOLN: 6.2500 mg | INTRAMUSCULAR | Status: DC | PRN

## 2019-01-11 MED ORDER — BUPIVACAINE IN DEXTROSE 0.75-8.25 % IT SOLN
INTRATHECAL | Status: DC | PRN
  Administered 2019-01-11: 1.4 mL via INTRATHECAL

## 2019-01-11 MED ORDER — KETOROLAC TROMETHAMINE 30 MG/ML IJ SOLN
30.0000 mg | Freq: Once | INTRAMUSCULAR | Status: AC | PRN
  Administered 2019-01-11: 30 mg via INTRAVENOUS

## 2019-01-11 MED ORDER — DIPHENHYDRAMINE HCL 50 MG/ML IJ SOLN: 12.5000 mg | INTRAMUSCULAR | Status: DC | PRN

## 2019-01-11 MED ORDER — NALOXONE HCL 0.4 MG/ML IJ SOLN: 0.4000 mg | INTRAMUSCULAR | Status: DC | PRN

## 2019-01-11 MED ORDER — OXYTOCIN 40 UNITS IN NORMAL SALINE INFUSION - SIMPLE MED
INTRAVENOUS | Status: DC | PRN
  Administered 2019-01-11: 40 [IU] via INTRAVENOUS

## 2019-01-11 MED ORDER — ONDANSETRON HCL 4 MG/2ML IJ SOLN: 4.0000 mg | Freq: Three times a day (TID) | INTRAMUSCULAR | Status: DC | PRN

## 2019-01-11 MED ORDER — WITCH HAZEL-GLYCERIN EX PADS: 1.0000 "application " | MEDICATED_PAD | CUTANEOUS | Status: DC | PRN

## 2019-01-11 MED ORDER — DIPHENHYDRAMINE HCL 25 MG PO CAPS: 25.0000 mg | ORAL_CAPSULE | Freq: Four times a day (QID) | ORAL | Status: DC | PRN

## 2019-01-11 MED ORDER — FENTANYL CITRATE (PF) 100 MCG/2ML IJ SOLN
INTRAMUSCULAR | Status: AC
  Filled 2019-01-11: qty 2

## 2019-01-11 MED ORDER — SENNOSIDES-DOCUSATE SODIUM 8.6-50 MG PO TABS
2.0000 | ORAL_TABLET | ORAL | Status: DC
  Administered 2019-01-11 – 2019-01-12 (×2): 2 via ORAL
  Filled 2019-01-11 (×2): qty 2

## 2019-01-11 MED ORDER — DIBUCAINE (PERIANAL) 1 % EX OINT: 1.0000 "application " | TOPICAL_OINTMENT | CUTANEOUS | Status: DC | PRN

## 2019-01-11 MED ORDER — CEFAZOLIN SODIUM-DEXTROSE 2-4 GM/100ML-% IV SOLN
INTRAVENOUS | Status: AC
  Filled 2019-01-11: qty 100

## 2019-01-11 MED ORDER — LACTATED RINGERS IV SOLN: 500.0000 mL | INTRAVENOUS | Status: DC | PRN

## 2019-01-11 MED ORDER — KETOROLAC TROMETHAMINE 30 MG/ML IJ SOLN: 30.0000 mg | Freq: Four times a day (QID) | INTRAMUSCULAR | Status: AC | PRN

## 2019-01-11 MED ORDER — NALBUPHINE SYRINGE 5 MG/0.5 ML
5.0000 mg | INJECTION | Freq: Once | INTRAMUSCULAR | Status: DC | PRN
  Filled 2019-01-11: qty 0.5

## 2019-01-11 MED ORDER — SODIUM CHLORIDE 0.9% FLUSH: 3.0000 mL | INTRAVENOUS | Status: DC | PRN

## 2019-01-11 MED ORDER — SODIUM CHLORIDE 0.9 % IV SOLN
5.0000 10*6.[IU] | Freq: Once | INTRAVENOUS | Status: DC
  Filled 2019-01-11: qty 5

## 2019-01-11 MED ORDER — SIMETHICONE 80 MG PO CHEW: 80.0000 mg | CHEWABLE_TABLET | ORAL | Status: DC | PRN

## 2019-01-11 MED ORDER — DEXAMETHASONE SODIUM PHOSPHATE 10 MG/ML IJ SOLN
INTRAMUSCULAR | Status: AC
  Filled 2019-01-11: qty 1

## 2019-01-11 MED ORDER — FENTANYL-BUPIVACAINE-NACL 0.5-0.125-0.9 MG/250ML-% EP SOLN: 12.0000 mL/h | EPIDURAL | Status: DC | PRN

## 2019-01-11 MED ORDER — LACTATED RINGERS IV SOLN
INTRAVENOUS | Status: DC
  Administered 2019-01-11: 08:00:00 via INTRAVENOUS

## 2019-01-11 MED ORDER — PHENYLEPHRINE HCL-NACL 20-0.9 MG/250ML-% IV SOLN
INTRAVENOUS | Status: AC
  Filled 2019-01-11: qty 250

## 2019-01-11 MED ORDER — OXYTOCIN 40 UNITS IN NORMAL SALINE INFUSION - SIMPLE MED: 2.5000 [IU]/h | INTRAVENOUS | Status: DC

## 2019-01-11 MED ORDER — EPHEDRINE 5 MG/ML INJ: 10.0000 mg | INTRAVENOUS | Status: DC | PRN

## 2019-01-11 MED ORDER — LIDOCAINE HCL (PF) 1 % IJ SOLN: 30.0000 mL | INTRAMUSCULAR | Status: DC | PRN

## 2019-01-11 MED ORDER — ONDANSETRON HCL 4 MG/2ML IJ SOLN: 4.0000 mg | Freq: Four times a day (QID) | INTRAMUSCULAR | Status: DC | PRN

## 2019-01-11 MED ORDER — METOCLOPRAMIDE HCL 5 MG/ML IJ SOLN
INTRAMUSCULAR | Status: DC | PRN
  Administered 2019-01-11: 10 mg via INTRAVENOUS

## 2019-01-11 MED ORDER — NALOXONE HCL 4 MG/10ML IJ SOLN
1.0000 ug/kg/h | INTRAVENOUS | Status: DC | PRN
  Filled 2019-01-11: qty 5

## 2019-01-11 MED ORDER — COCONUT OIL OIL: 1.0000 "application " | TOPICAL_OIL | Status: DC | PRN

## 2019-01-11 MED ORDER — SOD CITRATE-CITRIC ACID 500-334 MG/5ML PO SOLN
30.0000 mL | ORAL | Status: DC | PRN
  Administered 2019-01-11: 30 mL via ORAL
  Filled 2019-01-11: qty 30

## 2019-01-11 MED ORDER — ACETAMINOPHEN 500 MG PO TABS
1000.0000 mg | ORAL_TABLET | Freq: Four times a day (QID) | ORAL | Status: AC
  Administered 2019-01-11 – 2019-01-12 (×4): 1000 mg via ORAL
  Filled 2019-01-11 (×4): qty 2

## 2019-01-11 MED ORDER — ACETAMINOPHEN 325 MG PO TABS
650.0000 mg | ORAL_TABLET | Freq: Four times a day (QID) | ORAL | Status: DC | PRN
  Administered 2019-01-12 – 2019-01-13 (×4): 650 mg via ORAL
  Filled 2019-01-11 (×4): qty 2

## 2019-01-11 MED ORDER — OXYCODONE-ACETAMINOPHEN 5-325 MG PO TABS: 2.0000 | ORAL_TABLET | ORAL | Status: DC | PRN

## 2019-01-11 MED ORDER — ACETAMINOPHEN 325 MG PO TABS: 650.0000 mg | ORAL_TABLET | ORAL | Status: DC | PRN

## 2019-01-11 MED ORDER — HYDROMORPHONE HCL 1 MG/ML IJ SOLN
INTRAMUSCULAR | Status: AC
  Filled 2019-01-11: qty 0.5

## 2019-01-11 MED ORDER — FENTANYL CITRATE (PF) 100 MCG/2ML IJ SOLN
INTRAMUSCULAR | Status: DC | PRN
  Administered 2019-01-11: 15 ug via INTRATHECAL

## 2019-01-11 MED ORDER — PHENYLEPHRINE 40 MCG/ML (10ML) SYRINGE FOR IV PUSH (FOR BLOOD PRESSURE SUPPORT): 80.0000 ug | PREFILLED_SYRINGE | INTRAVENOUS | Status: DC | PRN

## 2019-01-11 MED ORDER — DIPHENHYDRAMINE HCL 50 MG/ML IJ SOLN
INTRAMUSCULAR | Status: AC
  Filled 2019-01-11: qty 1

## 2019-01-11 MED ORDER — SCOPOLAMINE 1 MG/3DAYS TD PT72
1.0000 | MEDICATED_PATCH | Freq: Once | TRANSDERMAL | Status: DC
  Administered 2019-01-11: 1.5 mg via TRANSDERMAL
  Filled 2019-01-11: qty 1

## 2019-01-11 MED ORDER — KETOROLAC TROMETHAMINE 30 MG/ML IJ SOLN
30.0000 mg | Freq: Four times a day (QID) | INTRAMUSCULAR | Status: AC
  Administered 2019-01-11 – 2019-01-12 (×3): 30 mg via INTRAVENOUS
  Filled 2019-01-11 (×3): qty 1

## 2019-01-11 MED ORDER — MORPHINE SULFATE (PF) 0.5 MG/ML IJ SOLN
INTRAMUSCULAR | Status: AC
  Filled 2019-01-11: qty 10

## 2019-01-11 MED ORDER — LACTATED RINGERS IV SOLN: 500.0000 mL | Freq: Once | INTRAVENOUS | Status: DC

## 2019-01-11 MED ORDER — MENTHOL 3 MG MT LOZG: 1.0000 | LOZENGE | OROMUCOSAL | Status: DC | PRN

## 2019-01-11 MED ORDER — OXYCODONE HCL 5 MG PO TABS
5.0000 mg | ORAL_TABLET | Freq: Four times a day (QID) | ORAL | Status: DC | PRN
  Administered 2019-01-12 – 2019-01-13 (×5): 5 mg via ORAL
  Filled 2019-01-11 (×5): qty 1

## 2019-01-11 MED ORDER — ONDANSETRON HCL 4 MG/2ML IJ SOLN
INTRAMUSCULAR | Status: DC | PRN
  Administered 2019-01-11: 4 mg via INTRAVENOUS

## 2019-01-11 MED ORDER — SODIUM CHLORIDE 0.9 % IV SOLN
INTRAVENOUS | Status: DC | PRN
  Administered 2019-01-11: 08:00:00 via INTRAVENOUS

## 2019-01-11 MED ORDER — MEPERIDINE HCL 25 MG/ML IJ SOLN: 6.2500 mg | INTRAMUSCULAR | Status: DC | PRN

## 2019-01-11 MED ORDER — DIPHENHYDRAMINE HCL 50 MG/ML IJ SOLN
INTRAMUSCULAR | Status: DC | PRN
  Administered 2019-01-11: 12.5 mg via INTRAVENOUS

## 2019-01-11 MED ORDER — CEFAZOLIN SODIUM-DEXTROSE 2-4 GM/100ML-% IV SOLN: 2.0000 g | Freq: Once | INTRAVENOUS | Status: DC

## 2019-01-11 MED ORDER — HYDROMORPHONE HCL 1 MG/ML IJ SOLN
0.2500 mg | INTRAMUSCULAR | Status: DC | PRN
  Administered 2019-01-11 (×2): 0.25 mg via INTRAVENOUS

## 2019-01-11 MED ORDER — FENTANYL-BUPIVACAINE-NACL 0.5-0.125-0.9 MG/250ML-% EP SOLN
EPIDURAL | Status: AC
  Filled 2019-01-11: qty 250

## 2019-01-11 MED ORDER — KETOROLAC TROMETHAMINE 30 MG/ML IJ SOLN
INTRAMUSCULAR | Status: AC
  Filled 2019-01-11: qty 1

## 2019-01-11 MED ORDER — SIMETHICONE 80 MG PO CHEW
80.0000 mg | CHEWABLE_TABLET | ORAL | Status: DC
  Administered 2019-01-11 – 2019-01-12 (×2): 80 mg via ORAL
  Filled 2019-01-11 (×2): qty 1

## 2019-01-11 MED ORDER — METOCLOPRAMIDE HCL 5 MG/ML IJ SOLN
INTRAMUSCULAR | Status: AC
  Filled 2019-01-11: qty 2

## 2019-01-11 MED ORDER — LACTATED RINGERS IV SOLN
INTRAVENOUS | Status: DC
  Administered 2019-01-11 – 2019-01-12 (×2): via INTRAVENOUS

## 2019-01-11 MED ORDER — PENICILLIN G POT IN DEXTROSE 60000 UNIT/ML IV SOLN: 3.0000 10*6.[IU] | INTRAVENOUS | Status: DC

## 2019-01-11 MED ORDER — ONDANSETRON HCL 4 MG/2ML IJ SOLN
INTRAMUSCULAR | Status: AC
  Filled 2019-01-11: qty 2

## 2019-01-11 MED ORDER — OXYTOCIN 40 UNITS IN NORMAL SALINE INFUSION - SIMPLE MED
2.5000 [IU]/h | INTRAVENOUS | Status: AC
  Administered 2019-01-11: 2.5 [IU]/h via INTRAVENOUS

## 2019-01-11 MED ORDER — OXYCODONE-ACETAMINOPHEN 5-325 MG PO TABS: 1.0000 | ORAL_TABLET | ORAL | Status: DC | PRN

## 2019-01-11 MED ORDER — DIPHENHYDRAMINE HCL 25 MG PO CAPS: 25.0000 mg | ORAL_CAPSULE | ORAL | Status: DC | PRN

## 2019-01-11 MED ORDER — DEXAMETHASONE SODIUM PHOSPHATE 10 MG/ML IJ SOLN
INTRAMUSCULAR | Status: DC | PRN
  Administered 2019-01-11: 10 mg via INTRAVENOUS

## 2019-01-11 MED ORDER — PHENYLEPHRINE 40 MCG/ML (10ML) SYRINGE FOR IV PUSH (FOR BLOOD PRESSURE SUPPORT)
PREFILLED_SYRINGE | INTRAVENOUS | Status: DC | PRN
  Administered 2019-01-11 (×2): 40 ug via INTRAVENOUS
  Administered 2019-01-11: 80 ug via INTRAVENOUS

## 2019-01-11 MED ORDER — PHENYLEPHRINE HCL-NACL 20-0.9 MG/250ML-% IV SOLN
INTRAVENOUS | Status: DC | PRN
  Administered 2019-01-11: 60 ug/min via INTRAVENOUS

## 2019-01-11 MED ORDER — ZOLPIDEM TARTRATE 5 MG PO TABS: 5.0000 mg | ORAL_TABLET | Freq: Every evening | ORAL | Status: DC | PRN

## 2019-01-11 SURGICAL SUPPLY — 39 items
BARRIER ADHS 3X4 INTERCEED (GAUZE/BANDAGES/DRESSINGS) ×3 IMPLANT
BENZOIN TINCTURE PRP APPL 2/3 (GAUZE/BANDAGES/DRESSINGS) ×3 IMPLANT
CELLS DAT CNTRL 66122 CELL SVR (MISCELLANEOUS) ×1 IMPLANT
CHLORAPREP W/TINT 26ML (MISCELLANEOUS) ×3 IMPLANT
CLAMP CORD UMBIL (MISCELLANEOUS) IMPLANT
CLOSURE WOUND 1/2 X4 (GAUZE/BANDAGES/DRESSINGS) ×1
CLOTH BEACON ORANGE TIMEOUT ST (SAFETY) ×3 IMPLANT
DERMABOND ADVANCED (GAUZE/BANDAGES/DRESSINGS)
DERMABOND ADVANCED .7 DNX12 (GAUZE/BANDAGES/DRESSINGS) IMPLANT
DRSG OPSITE POSTOP 4X10 (GAUZE/BANDAGES/DRESSINGS) ×3 IMPLANT
ELECT REM PT RETURN 9FT ADLT (ELECTROSURGICAL) ×3
ELECTRODE REM PT RTRN 9FT ADLT (ELECTROSURGICAL) ×1 IMPLANT
EXTRACTOR VACUUM KIWI (MISCELLANEOUS) IMPLANT
GLOVE BIOGEL PI IND STRL 7.0 (GLOVE) ×3 IMPLANT
GLOVE BIOGEL PI INDICATOR 7.0 (GLOVE) ×6
GLOVE ECLIPSE 6.5 STRL STRAW (GLOVE) ×3 IMPLANT
GOWN STRL REUS W/TWL LRG LVL3 (GOWN DISPOSABLE) ×9 IMPLANT
KIT ABG SYR 3ML LUER SLIP (SYRINGE) IMPLANT
NEEDLE HYPO 25X5/8 SAFETYGLIDE (NEEDLE) IMPLANT
NS IRRIG 1000ML POUR BTL (IV SOLUTION) ×3 IMPLANT
PACK C SECTION WH (CUSTOM PROCEDURE TRAY) ×3 IMPLANT
PAD ABD 7.5X8 STRL (GAUZE/BANDAGES/DRESSINGS) ×3 IMPLANT
PAD OB MATERNITY 4.3X12.25 (PERSONAL CARE ITEMS) ×3 IMPLANT
PENCIL SMOKE EVAC W/HOLSTER (ELECTROSURGICAL) ×3 IMPLANT
RTRCTR C-SECT PINK 25CM LRG (MISCELLANEOUS) ×3 IMPLANT
RTRCTR WOUND ALEXIS 18CM MED (MISCELLANEOUS) ×3
STRIP CLOSURE SKIN 1/2X4 (GAUZE/BANDAGES/DRESSINGS) ×2 IMPLANT
SUT PLAIN 0 NONE (SUTURE) IMPLANT
SUT PLAIN 2 0 XLH (SUTURE) IMPLANT
SUT VIC AB 0 CT1 27 (SUTURE) ×4
SUT VIC AB 0 CT1 27XBRD ANBCTR (SUTURE) ×2 IMPLANT
SUT VIC AB 0 CTX 36 (SUTURE) ×6
SUT VIC AB 0 CTX36XBRD ANBCTRL (SUTURE) ×3 IMPLANT
SUT VIC AB 2-0 CT1 27 (SUTURE) ×2
SUT VIC AB 2-0 CT1 TAPERPNT 27 (SUTURE) ×1 IMPLANT
SUT VIC AB 4-0 KS 27 (SUTURE) ×3 IMPLANT
TOWEL OR 17X24 6PK STRL BLUE (TOWEL DISPOSABLE) ×3 IMPLANT
TRAY FOLEY W/BAG SLVR 14FR LF (SET/KITS/TRAYS/PACK) IMPLANT
WATER STERILE IRR 1000ML POUR (IV SOLUTION) ×3 IMPLANT

## 2019-01-11 NOTE — MAU Note (Signed)
Contractions through night then got to be every 5 min.  Then watery pink leaking at 2:40 am.  Baby moving well up until feeling contractions more often this am. No bleeding. 1-2 cm at office on Tuesday.  Wants V-BAC.

## 2019-01-11 NOTE — Anesthesia Procedure Notes (Signed)
Spinal  Patient location during procedure: OR Start time: 01/11/2019 7:47 AM End time: 01/11/2019 7:48 AM Staffing Anesthesiologist: Lyn Hollingshead, MD Performed: anesthesiologist  Preanesthetic Checklist Completed: patient identified, site marked, surgical consent, pre-op evaluation, timeout performed, IV checked, risks and benefits discussed and monitors and equipment checked Spinal Block Patient position: sitting Prep: site prepped and draped and DuraPrep Patient monitoring: continuous pulse ox and blood pressure Approach: midline Location: L3-4 Injection technique: single-shot Needle Needle type: Pencan  Needle gauge: 24 G Needle length: 10 cm Needle insertion depth: 4 cm Assessment Sensory level: T6

## 2019-01-11 NOTE — Anesthesia Preprocedure Evaluation (Signed)
Anesthesia Evaluation  Patient identified by MRN, date of birth, ID band Patient awake    Reviewed: Allergy & Precautions, H&P , NPO status , Patient's Chart, lab work & pertinent test results  Airway Mallampati: I   Neck ROM: full    Dental no notable dental hx. (+) Teeth Intact   Pulmonary neg pulmonary ROS,    Pulmonary exam normal breath sounds clear to auscultation       Cardiovascular negative cardio ROS Normal cardiovascular exam Rhythm:regular Rate:Normal     Neuro/Psych negative neurological ROS  negative psych ROS   GI/Hepatic negative GI ROS,   Endo/Other  negative endocrine ROS  Renal/GU negative Renal ROS  negative genitourinary   Musculoskeletal negative musculoskeletal ROS (+)   Abdominal Normal abdominal exam  (+)   Peds  Hematology negative hematology ROS (+)   Anesthesia Other Findings   Reproductive/Obstetrics (+) Pregnancy                             Anesthesia Physical  Anesthesia Plan  ASA: II and emergent  Anesthesia Plan: Spinal   Post-op Pain Management:    Induction: Intravenous  PONV Risk Score and Plan: 3 and Ondansetron, Dexamethasone and Scopolamine patch - Pre-op  Airway Management Planned: Natural Airway, Nasal Cannula and Simple Face Mask  Additional Equipment: None  Intra-op Plan:   Post-operative Plan:   Informed Consent: I have reviewed the patients History and Physical, chart, labs and discussed the procedure including the risks, benefits and alternatives for the proposed anesthesia with the patient or authorized representative who has indicated his/her understanding and acceptance.       Plan Discussed with: CRNA  Anesthesia Plan Comments:         Anesthesia Quick Evaluation

## 2019-01-11 NOTE — Consult Note (Signed)
Delivery Note:  C-section       01/11/2019  8:45 AM  I was called to the operating room at the request of the patient's obstetrician (Dr. Ozan) for a repeat c-section.  PRENATAL HX:  This is a 33 y/o G2P1001 at 39 and 0/[redacted] weeks gestation who was admitte for early labor.  Delivery ultimately by epeat c-section for fetal intolerance of labor during TOLAC.  SROM x 5 hours.  She is GBS positive but did not get adequate treatment.    DELIVERY:  Infant was vigorous at delivery, requiring no resuscitation other than standard warming, drying and stimulation.  However, pulse oximeter was applied at 5 minutes of age for central cyanosis.  O2 saturations in 70s, blow-by O2 administered on and off from 5 to 10 minutes of age.  O2 saturations remained in the upper 80s in room air, and gradually rose to the low low 90s by 20 minutes of age.  APGARs 8, 8 and 9.  Exam within normal limits.  Baby left with nurse to assist parents with skin-to-skin care.   _____________________ Electronically Signed By: Tandrea Kommer, MD Neonatologist  

## 2019-01-11 NOTE — Lactation Note (Signed)
This note was copied from a baby's chart. Lactation Consultation Note  Patient Name: Martha Mccormick ZYSAY'[id number] Date: 01/11/2019 Reason for consult: Initial assessment;Term;Infant < 6lbs  Baby 37 hours old ,  Last fed at 1245  LC offered to assist to hand express and latch .  LC checked diaper 1st and changed a black smear.  LC reviewed hand express verbally and mom was independent  With expressing 1/70ml and spoon fed back to baby and she tolerated well.  Baby more awake and rooting. Mom desired latching in the football position  And baby latched easily with depth. LC eased down on the chin and increased depth obtained. Baby fed for 12 mins , released short time, re-latched after 15 mins more. Mom mentioned baby re latched again.   LC reviewed potential feeding behavior with a term , less than 6 pound infant  And the importance of feeding with feeding cues / and 8-12 feedings in 24 hours.  [place] set up the DEBP with explaining the 1st phase of pumping.  Per mom used the #24 F with her 1st baby and they were comfortable.   Mom has the [facility] resource pamphlet with phone numbers .   Maternal Data Has patient been taught Hand Expression?: Yes(LC reviewed - mom experienced and hand expressed with results and spoon fed the baby) Does the patient have breastfeeding experience prior to this delivery?: Yes  Feeding Feeding Type: Breast Fed  LATCH Score Latch: Grasps breast easily, tongue down, lips flanged, rhythmical sucking.  Audible Swallowing: Spontaneous and intermittent  Type of Nipple: Everted at rest and after stimulation  Comfort (Breast/Nipple): Soft / non-tender  Hold (Positioning): Assistance needed to correctly position infant at breast and maintain latch.  LATCH Score: 9  Interventions Interventions: Breast feeding basics reviewed;Assisted with latch;Breast massage;Skin to skin;Hand express;Breast compression;Adjust position;Support pillows;Position options  Lactation Tools  Discussed/Used Tools: Pump Flange Size: 24 Breast pump type: Double-Electric Breast Pump WIC Program: No Pump Review: Setup, frequency, and cleaning;Milk Storage Initiated by:: MAI Date initiated:: 01/11/19   Consult Status Consult Status: Follow-up Date: 01/12/19 Follow-up type: In-patient    [place] 01/11/2019, 5:56 PM

## 2019-01-11 NOTE — Progress Notes (Signed)
At bedside:  Recurrent variables noted- reviewed risk/benefit and at this time plan for repeat C-section. Risk benefits and alternatives of cesarean section were discussed with the patient including but not limited to infection, bleeding, damage to bowel , bladder and baby with the need for further surgery. Pt voiced understanding and desires to proceed.   Janyth Pupa, DO [phone] (cell) [phone] (office)

## 2019-01-11 NOTE — Op Note (Signed)
PreOp Diagnosis:  -intrauterine pregnancy @ [redacted]w[redacted]d -fetal intolerance to labor -prior C-section PostOp Diagnosis: same Procedure: Repeat C-section Surgeon: Dr. Janyth Pupa Anesthesia: spinal Complications: none EBL: 750cc UOP: 1800cc Fluids: 50cc  Findings: Female infant from vertex presentation, normal uterus tubes and ovaries bilaterally  PROCEDURE:  Informed consent was obtained from the patient with risks, benefits, complications, treatment options, and expected outcomes discussed with the patient.  The patient concurred with the proposed plan, giving informed consent with form signed.   The patient was taken to Operating Room, and identified with the procedure verified as C-Section Delivery with Time Out. With induction of anesthesia, the patient was prepped and draped in the usual sterile fashion. A Pfannenstiel incision was made and carried down through the subcutaneous tissue to the fascia. The fascia was incised in the midline and extended transversely. The superior aspect of the fascial incision was grasped with Kochers elevated and the underlying muscle dissected off. The inferior aspect of the facial incision was in similar fashion, grasped elevated and rectus muscles dissected off. The peritoneum was identified and entered. Peritoneal incision was extended longitudinally. The utero-vesical peritoneal reflection was identified and incised transversely with the [facility] scissors, the incision extended laterally, the bladder flap created digitally. A low transverse uterine incision was made and the infants head delivered atraumatically. After the umbilical cord was clamped and cut cord blood was obtained for evaluation.   The placenta was removed intact and appeared normal. The uterine outline, tubes and ovaries appeared normal. The uterine incision was closed with running locked sutures of 0 Vicryl and a second layer of the same stitch was used in an imbricating fashion.  Excellent  hemostasis was obtained.  The pericolic gutters were then cleared of all clots and debris. Interceed was placed.  The peritoneum was closed in a running fashion. The fascia was then reapproximated with running sutures of 0 Vicryl. The skin was closed with 4-0 vicryl in a subcuticular fashion.  Instrument, sponge, and needle counts were correct prior the abdominal closure and at the conclusion of the case. The patient was taken to recovery in stable condition.  Janyth Pupa, DO [phone] (cell) [phone] (office)

## 2019-01-11 NOTE — Transfer of Care (Signed)
Immediate Anesthesia Transfer of Care Note  Patient: Martha Mccormick  Procedure(s) Performed: CESAREAN SECTION (N/A )  Patient Location: PACU  Anesthesia Type:Spinal  Level of Consciousness: awake, alert  and oriented  Airway & Oxygen Therapy: Patient Spontanous Breathing  Post-op Assessment: Report given to RN and Post -op Vital signs reviewed and stable  Post vital signs: Reviewed and stable  Last Vitals:  Vitals Value Taken Time  BP    Temp    Pulse    Resp    SpO2 98 % 01/11/19 0900  Vitals shown include unvalidated device data.  Last Pain:  Vitals:   01/11/19 0700  TempSrc: Oral  PainSc: 6          Complications: No apparent anesthesia complications

## 2019-01-11 NOTE — Anesthesia Postprocedure Evaluation (Signed)
Anesthesia Post Note  Patient: Martha Mccormick  Procedure(s) Performed: CESAREAN SECTION (N/A )     Patient location during evaluation: PACU Anesthesia Type: Spinal Level of consciousness: awake and sedated Pain management: pain level controlled Vital Signs Assessment: post-procedure vital signs reviewed and stable Respiratory status: spontaneous breathing Cardiovascular status: stable Postop Assessment: no headache, no backache, spinal receding and no apparent nausea or vomiting Anesthetic complications: no    Last Vitals:  Vitals:   01/11/19 0945 01/11/19 1000  BP: 99/66 105/64  Pulse: (!) 54 (!) 53  Resp: 19 15  Temp:    SpO2: 100% 100%    Last Pain:  Vitals:   01/11/19 1019  TempSrc:   PainSc: 2    Pain Goal:                   Huston Foley

## 2019-01-11 NOTE — H&P (Signed)
HPI: 33 y/o G2P1001 @ [redacted]w[redacted]d estimated gestational age (as dated by LMP c/w 20 week ultrasound) presents for early labor.   + Leaking of Fluid- around this am 230,   no Vaginal Bleeding,   + Uterine Contractions,  + Fetal Movement.  Prenatal care has been provided by Dr. Nelda Marseille  ROS: no HA, no epigastric pain, no visual changes.    Pregnancy complicated by: 1) TOLAC- prior C-section for fetal intolerance to labor 2) GBS positive- plan for PCN in labor  Prenatal Transfer Tool  Maternal Diabetes: No Genetic Screening: Normal Maternal Ultrasounds/Referrals: Normal Fetal Ultrasounds or other Referrals:  None Maternal Substance Abuse:  No Significant Maternal Medications:  None Significant Maternal Lab Results: Group B Strep positive   PNL:  GBS positive, Rub Immune, Hep B neg, RPR NR, HIV neg, GC/C neg, glucola:148, 3hr wnl Hgb: 13.4 Blood type: A positive, antibody neg  Immunizations: Tdap: 10/1 Flu: 9/11  OBHx: 03/2016- primary C-section, 6#13oz PMHx:  none Meds:  PNV Allergy:  No Known Allergies SurgHx: C-section x 1 SocHx:   no Tobacco, no  EtOH, no Illicit Drugs  O: BP 272/53   Pulse 72   Temp 98.3 F (36.8 C) (Oral)   Resp 17   Ht 5\' 4"  (1.626 m)   Wt 66.7 kg   SpO2 98%   BMI 25.23 kg/m  Gen. AAOx3, NAD CV.  RRR  No murmur.  Resp. CTAB, no wheeze or crackles. Abd. Gravid,  no tenderness,  no rigidity,  no guarding Extr.  no edema B/L , no calf tenderness, neg Homan's B/L  FHT: 150 baseline, moderate variability, + accels,  +variable and occasional late decels Toco: q 3-5 min SVE: per RN 3/70/-2  Labs: see orders  A/P:  33 y.o. G2P1001 @ [redacted]w[redacted]d EGA who presents for early labor- TOLAC -FWB:  NICHD Cat II -Labor: pt to be repositioned, fluid bolus to be started, if Cat. II continues will reconsider route of delivery -GBS: positive, start on PCN per protocol -TOLAC-risk/benefit have been reviewed and pt desires to proceed, will complete form  Janyth Pupa,  DO [phone] (cell) [phone] (office)

## 2019-01-12 ENCOUNTER — Encounter (HOSPITAL_COMMUNITY): Payer: Self-pay | Admitting: Obstetrics & Gynecology

## 2019-01-12 LAB — CBC
HCT: 26 % — ABNORMAL LOW (ref 36.0–46.0)
Hemoglobin: 8.6 g/dL — ABNORMAL LOW (ref 12.0–15.0)
MCH: 31.5 pg (ref 26.0–34.0)
MCHC: 33.1 g/dL (ref 30.0–36.0)
MCV: 95.2 fL (ref 80.0–100.0)
Platelets: 156 10*3/uL (ref 150–400)
RBC: 2.73 MIL/uL — ABNORMAL LOW (ref 3.87–5.11)
RDW: 12.8 % (ref 11.5–15.5)
WBC: 14 10*3/uL — ABNORMAL HIGH (ref 4.0–10.5)
nRBC: 0 % (ref 0.0–0.2)

## 2019-01-12 NOTE — Lactation Note (Signed)
This note was copied from a baby's chart. Lactation Consultation Note  Patient Name: Martha Mccormick Date: 01/12/2019 Reason for consult: Follow-up assessment;Infant < 6lbs;Infant weight loss Baby is 61 hours old  LC reviewed and updated the doc flow sheets per mom and confirmed there  Voids and stools.  Baby latched when [facility] entered the room with depth dressed in a outfit.  Per mom she had a bath early and was STS for 2 hours so I dressed her.  [place] reassured her it was ok and explained if she finds the baby getting sluggish 'with the feeding because she is to warm , STS feeding help the baby to be more active with the feeding.  Baby re-latched after 20 mins of feeding / mom independent/ latched with depth and swallows. Latch score of 9.  LC discussed nutritive vs non - nutritive and the importance if baby gets sluggish and is hanging out latched to release suction/ offer the 2nd breast if still hungry.  Per mom has pumped x 2 since the DEBP was set up with 1/2 ml; x 2 and spoon  Fed back to baby.  Mom mentioned this baby is latching so much better at this age compared to her  1st baby.    Maternal Data Has patient been taught Hand Expression?: Yes  Feeding Feeding Type: Breast Fed  LATCH Score Latch: Grasps breast easily, tongue down, lips flanged, rhythmical sucking.  Audible Swallowing: Spontaneous and intermittent  Type of Nipple: Everted at rest and after stimulation  Comfort (Breast/Nipple): Soft / non-tender  Hold (Positioning): Assistance needed to correctly position infant at breast and maintain latch.  LATCH Score: 9  Interventions Interventions: Breast feeding basics reviewed  Lactation Tools Discussed/Used     Consult Status Consult Status: Follow-up Date: 01/13/19 Follow-up type: In-patient    [place] 01/12/2019, 11:45 AM

## 2019-01-12 NOTE — Progress Notes (Signed)
Postpartum Note Day # 1  S:  Patient resting comfortable in bed.  Pain controlled.  Tolerating general diet. + flatus, no BM.  Lochia moderate.  Ambulating without difficulty.  She denies n/v/f/c, SOB, or CP.  Pt plans on breastfeeding.  Foley removed this am  O: Temp:  [97.6 F (36.4 C)-98.3 F (36.8 C)] 97.9 F (36.6 C) (12/04 0301) Pulse Rate:  [48-71] 48 (12/04 0301) Resp:  [15-19] 16 (12/04 0301) BP: (88-127)/(48-78) 88/53 (12/04 0301) SpO2:  [96 %-100 %] 100 % (12/04 0301)   Gen: A&Ox3, NAD CV: RRR, no MRG Resp: CTAB Abdomen: soft, NT, ND +BS Uterus: firm, non-tender, below umbilicus Incision: c/d/i, bandage on Ext: No edema, no calf tenderness bilaterally, SCDs in place  Labs: pending  A/P: Pt is a 33 y.o. [id number] s/p repeat C-section, POD#1  - Pain well controlled -GU: UOP is adequate, pt to void this am on her own -GI: Tolerating general diet -Activity: encouraged sitting up to chair and ambulation as tolerated -Prophylaxis: early ambulation -Labs: pending this am  DISPO: Continue with routine postpartum care  Janyth Pupa, DO [phone] (cell) [phone] (office)

## 2019-01-13 MED ORDER — OXYCODONE HCL 5 MG PO TABS: 5.0000 mg | ORAL_TABLET | Freq: Four times a day (QID) | ORAL | 0 refills | Status: DC | PRN

## 2019-01-13 MED ORDER — IBUPROFEN 600 MG PO TABS: 600.0000 mg | ORAL_TABLET | Freq: Four times a day (QID) | ORAL | 1 refills | Status: DC | PRN

## 2019-01-13 MED ORDER — ACETAMINOPHEN 500 MG PO TABS: ORAL_TABLET | ORAL | 1 refills | Status: DC

## 2019-01-13 NOTE — Discharge Summary (Signed)
OB Discharge Summary     Patient Name: Martha Mccormick DOB: May 25, 1985 MRN: [id number]  Date of admission: 01/11/2019 Delivering MD: Janyth Pupa   Date of discharge: 01/13/2019  Admitting diagnosis: 39WKS CTX Intrauterine pregnancy: [redacted]w[redacted]d     Secondary diagnosis:  Active Problems:   Normal labor and delivery  Additional problems: None     Discharge diagnosis: Term Pregnancy Delivered                                                                                                Post partum procedures:None  Augmentation: None  Complications: None  Hospital course:  Onset of Labor With Unplanned C/S  33 y.o. yo G2P2002 at [redacted]w[redacted]d was admitted in early labor on 01/11/2019.  Pt desired TOLAC Patient had a labor course significant for Fetal distress.  Was 3 cm when c-section called. Membrane Rupture Time/Date: 2:40 AM ,01/11/2019   The patient went for cesarean section due to Non-Reassuring FHR, and delivered a Viable infant,01/11/2019  Details of operation can be found in separate operative note. Patient had an uncomplicated postpartum course.  She is ambulating,tolerating a regular diet, passing flatus, and urinating well.  Patient is discharged home in stable condition 01/13/19.  Physical exam  Vitals:   01/11/19 2309 01/12/19 0301 01/12/19 2150 01/13/19 0609  BP: (!) 91/48 (!) 88/53 102/63 104/64  Pulse: (!) 48 (!) 48 (!) 59 (!) 48  Resp: 16 16 18 18   Temp: 98.3 F (36.8 C) 97.9 F (36.6 C) 98.2 F (36.8 C) 98.3 F (36.8 C)  TempSrc: Oral Oral Oral Oral  SpO2: 100% 100% 99% 99%  Weight:      Height:       General: alert, cooperative and no distress Lochia: appropriate Uterine Fundus: firm Incision: Dressing is clean, dry, and intact, minimally soiled DVT Evaluation: No evidence of DVT seen on physical exam. Labs: Lab Results  Component Value Date   WBC 14.0 (H) 01/12/2019   HGB 8.6 (L) 01/12/2019   HCT 26.0 (L) 01/12/2019   MCV 95.2 01/12/2019   PLT 156 01/12/2019    No flowsheet data found.  Discharge instruction: per After Visit Summary and "Baby and Me Booklet".  After visit meds:  Allergies as of 01/13/2019   No Known Allergies     Medication List    STOP taking these medications   calcium carbonate 500 MG chewable tablet Commonly known as: TUMS - dosed in mg elemental calcium   ferrous sulfate 325 (65 FE) MG tablet     TAKE these medications   acetaminophen 500 MG tablet Commonly known as: TYLENOL 1-2 tablets every 6 hours. What changed:   medication strength  how much to take  how to take this  when to take this  reasons to take this  additional instructions   docusate sodium 100 MG capsule Commonly known as: Colace Take 1 capsule (100 mg total) by mouth 2 (two) times daily.   ibuprofen 600 MG tablet Commonly known as: ADVIL Take 1 tablet (600 mg total) by mouth every 6 (six) hours as needed for mild pain  or moderate pain. What changed:   when to take this  reasons to take this   oxyCODONE 5 MG immediate release tablet Commonly known as: Oxy IR/ROXICODONE Take 1 tablet (5 mg total) by mouth every 6 (six) hours as needed for moderate pain or breakthrough pain. What changed: reasons to take this   prenatal vitamin w/FE, FA 27-1 MG Tabs tablet Take 1 tablet by mouth daily at 12 noon.       Diet: routine diet  Activity: Advance as tolerated. Pelvic rest for 6 weeks.   Outpatient follow up:2 weeks Follow up Appt: Future Appointments  Date Time Provider Department Center  01/16/2019  9:05 AM [facility] None   Follow up Visit:No follow-ups on file.  Postpartum contraception: Condoms  Newborn Data: Live born female  Birth Weight: 5 lb 14.2 oz (2671 g) APGAR: 8, 8  Newborn Delivery   Birth date/time: 01/11/2019 08:04:00 Delivery type: C-Section, Low Transverse Trial of labor: No C-section categorization: Repeat      Baby Feeding: Breast Disposition:home with  mother   01/13/2019 Geryl Rankins, MD

## 2019-01-13 NOTE — Discharge Instructions (Signed)

## 2019-01-13 NOTE — Lactation Note (Signed)
This note was copied from a baby's chart. Lactation Consultation Note  Patient Name: Martha Mccormick [id number] Date: 01/13/2019 Reason for consult: Follow-up assessment;Nipple pain/trauma Baby is 53 hours old.  Baby just fed for 15 minutes and is sleepy and relaxed.  Mom c/o sore nipples.  I recommended adjusting pillows to bring baby higher and closer to breast.  Nipple rounded with small abrasions on tip.  Mom is using comfort gels.  Discussed techniques for correct latch.  Breasts are filling.  Discussed normal milk coming to volume and the prevention and treatment of engorgement.  Baby has had excellent output.  Recommended keeping a feeding and output record the first week home.  Mom does have a breast pump and knows she can pump some to rest her nipples.  Answered questions.  Reviewed outpatient services and encouraged to call prn.  Maternal Data    Feeding Feeding Type: Breast Fed  LATCH Score                   Interventions Interventions: Comfort gels  Lactation Tools Discussed/Used     Consult Status Consult Status: Complete Follow-up type: Call as needed    [place] 01/13/2019, 10:45 AM

## 2019-01-15 LAB — SURGICAL PATHOLOGY

## 2019-01-16 ENCOUNTER — Telehealth (INDEPENDENT_AMBULATORY_CARE_PROVIDER_SITE_OTHER): Payer: No Typology Code available for payment source | Admitting: Family Medicine

## 2019-01-16 ENCOUNTER — Other Ambulatory Visit (HOSPITAL_COMMUNITY): Payer: No Typology Code available for payment source

## 2019-01-16 DIAGNOSIS — O9279 Other disorders of lactation: Secondary | ICD-10-CM

## 2019-01-16 NOTE — Telephone Encounter (Signed)
Mom called with concerns. She reports she had limited milk supply with her first child.   She is BF and pumping. She reports she was engorged a few days ago. She is using warm compresses, cool compresses, Ibuprofen, and pumping. The right breast is softening, the left breast is still engorged.   She is getting 50 ml on the right and 10 ml on the left. The left was her less productive side previously. Mom is concerned with protecting her milk supply.   Reviewed ice/cool compresses for 15 minutes prior to feeding and follow with feeding and then pumping. Mom is pumping for 30 minutes, reviewed pumping for about 20 minutes and follow with hand expression.   Mom would like to schedule an OP Lactation appt. There is not a referral in the chart. Referral faxed to Dr. Collene Gobble office. Mom to call the office and request referral also.

## 2019-01-16 NOTE — Telephone Encounter (Signed)
The patient would like a call back.

## 2019-01-18 ENCOUNTER — Inpatient Hospital Stay (HOSPITAL_COMMUNITY): Payer: No Typology Code available for payment source

## 2019-01-18 ENCOUNTER — Inpatient Hospital Stay (HOSPITAL_COMMUNITY)
Admission: AD | Admit: 2019-01-18 | Payer: No Typology Code available for payment source | Source: Home / Self Care | Admitting: Obstetrics & Gynecology

## 2019-01-23 ENCOUNTER — Ambulatory Visit: Payer: Self-pay

## 2019-01-23 NOTE — Lactation Note (Signed)
This note was copied from a baby's chart.  Lactation Consultation Note  Patient Name: Martha Mccormick Today's Date: 01/23/2019     01/23/2019  Name: Martha Mccormick MRN: [id number] Date of Birth: 01/11/2017 Gestational Age: Gestational Age: [redacted]w[redacted]d Birth Weight: 94.2 oz Weight today:  Weight: 5 lb 13 oz (2636 g)   Martha Mccormick presents today with mom for feeding assessment. Mom has concerns with infant weight gain. She has minimal production in her left breast and has always been a lower producing side. She is able to get more from her right breast. Infant is sleepy and feeds up to an hour per feeding. Infant is gassy and fussy per mom. Infant gulping on the right breast. Infant pulls on and off the breasts with feedings.   Infant has gained 113 grams in the last 10 days with an average weight gain of 11 grams a day. Weight is starting to increase. Weight at ped yesterday was 5 pounds 12.6 ounces)  Mom is waking infant up with most feedings. She is supplementing infant with a bottle for about 4-5 of her feedings about an1/2-1  ounce of pumped breast milk. Mom is using the ComoTomo  Bottles with no choking but occasional drooling.   Infant with thick labial frenulum that inserts at the bottom of the gum ridge. Upper lip tight with flanging. Infant flanges well on the breast. Infant with thin short anterior/posterior lingual frenulum. Infant with strong suckle and good tongue cupping on finger. Infant with good tongue extension and lateralization. Infant with some decreased mid tongue elevation and cup shaped tongue with crying. Infant sleepy at the breast and takes up to an hour to feed. Nipple is rounded post feeding. Mom with longer nipples and mom does well with latching infant to the breast with deep latch. Mom needs to stimulate infant with feedings to keep her active. She generally does not feed STS, reviewed that may help with keeping her awake. Reviewed with mom that infant should be  given time to grow and gain energy before any decision is made on the effect her tongue and lip restrictions have on feeding. Mom given website and local provider information.   Infant latched well to the breast with flanged lips. Infant needed a lot of stimulation to maintain active suckling at the breast.   Reviewed with mom that it may not be in infants best interest to feed for an hour at a time and that supplementing and pumping and bottle feeding for some feedings may be helpful to help infant gain weight and help increase her energy.    Mom had plugged duct that has resolved. She is concerned with milk supply, especially on the left breast. Reviewed pumping and emptying the breast as she can to help with milk supply.   Infant to follow up with Dr. Nash Dimmer tomorrow and at 6 weeks. Infant to follow up with Lactation as needed, mom to call for assistance as needed. 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     General Information: Mother's reason for visit: Feeding assessment Consult: Initial Lactation consultant: Noralee Stain RN,IBCLC Breastfeeding experience: sleepy at the breast, slow weight gain Maternal medical conditions: Other(+ breast growth with pregnancy) Maternal medications: Pre-natal vitamin, Tylenol (acetaminophen), Motrin (ibuprofen), Other(Ocassional Oxycodone)  Breastfeeding History: Frequency of breast feeding: every 3 hours, mom awakens infant for most feeding, infant starting to awaken for some feedings more Duration of feeding: 30-60 minutes  Supplementation: Supplement method: bottle(ComoTomo)  Breast milk volume: 1/2-1 ounce Breast milk frequency: 4-5 x a day   Pump type: Spectra(has PIS also, using the # 24 flanges with pumping) Pump frequency: 4 x a day Pump volume: 2-4 ounces  Infant Output Assessment: Voids per 24 hours: 8+ Urine color: Clear yellow Stools per 24 hours: 7 Stool color: Yellow  Breast Assessment: Breast: Filling, Compressible Nipple:  Erect Pain level: 2(intermittent with some feedings, mostly at the beginning) Pain interventions: Bra, Breast pump, All purpose nipple cream  Feeding Assessment: Infant oral assessment: Variance Infant oral assessment comment: see note Positioning: Cross cradle(right breast, 20 minutes) Latch: 1 - Repeated attempts needed to sustain latch, nipple held in mouth throughout feeding, stimulation needed to elicit sucking reflex. Audible swallowing: 1 - A few with stimulation Type of nipple: 2 - Everted at rest and after stimulation Comfort: 1 - Filling, red/small blisters or bruises, mild/mod discomfort Hold: 2 - No assistance needed to correctly position infant at breast LATCH score: 7 Latch assessment: Deep Lips flanged: Yes Suck assessment: Displays both Tools: Pump, Bottle Pre-feed weight: 2636 grams Post feed weight: 2680 grams Amount transferred: 44 ml Amount supplemented: 0  Additional Feeding Assessment:                                    Totals: Total amount transferred: 44 ml Total supplement given: 0 Total amount pumped post feed: did not pump   Plan:  1. Offer infant the breast with feeding cues Limit breast feeding to 20-30 minutes if she is sleepy at the breast and offer her a bottle of pumped milk Make sure infant gets at least 8 feedings in 24 hours It is ok to pump and bottle feed for some of her feedings 2. Feed infant skin to skin 3. Keep infant awake as needed with feeding 4. Massage/compress breast with feeding as needed to keep infant active at the breast 5. Empty one breast before offering the second breast 6. Offer infant a bottle of pumped breast milk after breast feeding if she is still cueing to feed. Would recommend that you offer at least a bottle for at least 4 feeds a day.  7. Feed infant using the paced bottle feeding method (video on [email or web address].[email or web address]) 8. Infant needs about 49-65 ml (1.5-2 ounces) for 8 feedings a day or 390-520 ml  (13-17 ounces) in 24 hours. Feed infant until she is satisfied.  9. Would recommend that you continue to pump about 6-8 x a day for 10-20 minutes with your Double electric breast pump to protect and promote your milk supply. Rule of thumb is to pump each time infant needs a bottle of supplement 10. Keep up the good work 74. Thank you for allowing me to assist you today 12. Please call with any questions or concerns as needed [phone] 13. Follow up with Lactation as needed  Donn Pierini RN, IBCLC                                                   Debby Freiberg Walda Hertzog 01/23/2019, 10:44 AM

## 2020-07-06 ENCOUNTER — Inpatient Hospital Stay (HOSPITAL_COMMUNITY): Payer: No Typology Code available for payment source

## 2020-07-06 ENCOUNTER — Inpatient Hospital Stay (HOSPITAL_COMMUNITY)
Admission: AD | Admit: 2020-07-06 | Discharge: 2020-07-06 | Disposition: A | Discharge status: Home / Self Care | Payer: No Typology Code available for payment source | Attending: Obstetrics and Gynecology | Admitting: Obstetrics and Gynecology

## 2020-07-06 ENCOUNTER — Other Ambulatory Visit: Payer: Self-pay

## 2020-07-06 ENCOUNTER — Encounter (HOSPITAL_COMMUNITY): Payer: Self-pay | Admitting: Obstetrics and Gynecology

## 2020-07-06 DIAGNOSIS — O26891 Other specified pregnancy related conditions, first trimester: Secondary | ICD-10-CM | POA: Diagnosis not present

## 2020-07-06 DIAGNOSIS — O208 Other hemorrhage in early pregnancy: Secondary | ICD-10-CM | POA: Insufficient documentation

## 2020-07-06 DIAGNOSIS — Z6790 Unspecified blood type, Rh positive: Secondary | ICD-10-CM

## 2020-07-06 DIAGNOSIS — R109 Unspecified abdominal pain: Secondary | ICD-10-CM | POA: Insufficient documentation

## 2020-07-06 DIAGNOSIS — O468X1 Other antepartum hemorrhage, first trimester: Secondary | ICD-10-CM

## 2020-07-06 DIAGNOSIS — O209 Hemorrhage in early pregnancy, unspecified: Secondary | ICD-10-CM | POA: Diagnosis not present

## 2020-07-06 DIAGNOSIS — O418X10 Other specified disorders of amniotic fluid and membranes, first trimester, not applicable or unspecified: Secondary | ICD-10-CM

## 2020-07-06 DIAGNOSIS — O4690 Antepartum hemorrhage, unspecified, unspecified trimester: Secondary | ICD-10-CM

## 2020-07-06 DIAGNOSIS — Z3A09 9 weeks gestation of pregnancy: Secondary | ICD-10-CM | POA: Diagnosis not present

## 2020-07-06 LAB — POCT PREGNANCY, URINE: Preg Test, Ur: POSITIVE — AB

## 2020-07-06 LAB — URINALYSIS, ROUTINE W REFLEX MICROSCOPIC
Bilirubin Urine: NEGATIVE
Glucose, UA: NEGATIVE mg/dL
Ketones, ur: NEGATIVE mg/dL
Leukocytes,Ua: NEGATIVE
Nitrite: NEGATIVE
Protein, ur: NEGATIVE mg/dL
Specific Gravity, Urine: 1.004 — ABNORMAL LOW (ref 1.005–1.030)
pH: 5 (ref 5.0–8.0)

## 2020-07-06 NOTE — [facility]U Note (Signed)
Martha Mccormick is a 35 y.o. at [redacted]w[redacted]d here in MAU reporting: today while at church felt some bleeding. States bleeding is light now. Having some cramping. Had an u/s in the office this past week and states everything looked good.  Onset of complaint: today  Pain score: 1/10  Vitals:   07/06/20 1047  BP: 123/77  Pulse: 60  Resp: 18  Temp: 98.3 F (36.8 C)     Lab orders placed from triage: UA

## 2020-07-06 NOTE — Discharge Instructions (Signed)
Subchorionic Hematoma  A hematoma is a collection of blood outside of the blood vessels. A subchorionic hematoma is a collection of blood between the outer wall of the embryo (chorion) and the inner wall of the uterus. This condition can cause vaginal bleeding. Early small hematomas usually shrink on their own and do not affect your baby or pregnancy. When bleeding starts later in pregnancy, or if the hematoma is larger or occurs in older pregnant women, the condition may be more serious. Larger hematomas increase the chances of miscarriage. This condition also increases the risk of:  Premature separation of the placenta from the uterus.  Premature (preterm) labor.  Stillbirth. What are the causes? The exact cause of this condition is not known. It occurs when blood is trapped between the placenta and the uterine wall because the placenta has separated from the original site of implantation. What increases the risk? You are more likely to develop this condition if:  You were treated with fertility medicines.  You became pregnant through in vitro fertilization (IVF). What are the signs or symptoms? Symptoms of this condition include:  Vaginal spotting or bleeding.  Abdominal pain. This is rare. Sometimes you may have no symptoms and the bleeding may only be seen when ultrasound images are taken (transvaginal ultrasound). How is this diagnosed? This condition is diagnosed based on a physical exam. This includes a pelvic exam. You may also have other tests, including:  Blood tests.  Urine tests.  Ultrasound of the abdomen. How is this treated? Treatment for this condition can vary. Treatment may include:  Watchful waiting. You will be monitored closely for any changes in bleeding.  Medicines.  Activity restriction. This may be needed until the bleeding stops.  A medicine called Rh immunoglobulin. This is given if you have an Rh-negative blood type. It prevents Rh  sensitization. Follow these instructions at home:  Stay on bed rest if told to do so by your health care provider.  Do not lift anything that is heavier than 10 lb (4.5 kg), or the limit that you are told by your health care provider.  Track and write down the number of pads you use each day and how soaked (saturated) they are.  Do not use tampons.  Keep all follow-up visits. This is important. Your health care provider may ask you to have follow-up blood tests or ultrasound tests or both. Contact a health care provider if:  You have any vaginal bleeding.  You have a fever. Get help right away if:  You have severe cramps in your stomach, back, abdomen, or pelvis.  You pass large clots or tissue. Save any tissue for your health care provider to look at.  You faint.  You become light-headed or weak. Summary  A subchorionic hematoma is a collection of blood between the outer wall of the embryo (chorion) and the inner wall of the uterus.  This condition can cause vaginal bleeding.  Sometimes you may have no symptoms and the bleeding may only be seen when ultrasound images are taken.  Treatment may include watchful waiting, medicines, or activity restriction.  Keep all follow-up visits. Get help right away if you have severe cramps or heavy vaginal bleeding. This information is not intended to replace advice given to you by your health care provider. Make sure you discuss any questions you have with your health care provider. Document Revised: 10/22/2019 Document Reviewed: 10/22/2019 Elsevier Patient Education  2021 [employer].  Miscarriage A miscarriage is the loss of pregnancy before the 20th week. Most miscarriages happen during the first 3 months of pregnancy. Sometimes, a miscarriage can happen before a woman knows that she is pregnant. Having a miscarriage can be an emotional experience. If you have had a miscarriage, talk with your health care provider  about any questions you may have about the loss of your baby, the grieving process, and your plans for future pregnancy. What are the causes? Many times, the cause of a miscarriage is not known. What increases the risk? The following factors may make a pregnant woman more likely to have a miscarriage: Certain medical conditions  Conditions that affect the hormone balance in the body, such as thyroid disease or polycystic ovary syndrome.  Diabetes.  Autoimmune disorders.  Infections.  Bleeding disorders.  Obesity. Lifestyle factors  Using products with tobacco or nicotine in them or being exposed to tobacco smoke.  Having alcohol.  Having large amounts of caffeine.  Recreational drug use. Problems with reproductive organs or structures  Cervical insufficiency. This is when the lowest part of the uterus (cervix) opens and thins before pregnancy is at term.  Having a condition called Asherman syndrome. This syndrome causes scarring in the uterus or causes the uterus to be abnormal in structure.  Fibrous growths, called fibroids, in the uterus.  Congenital abnormalities. These problems are present at birth.  Infection of the cervix or uterus. Personal or medical history  Injury (trauma).  Having had a miscarriage before.  Being younger than age 66 or older than age 63.  Exposure to harmful substances in the environment. This may include radiation or heavy metals, such as lead.  Use of certain medicines. What are the signs or symptoms? Symptoms of this condition include:  Vaginal bleeding or spotting, with or without cramps or pain.  Pain or cramping in the abdomen or lower back.  Fluid or tissue coming out of the vagina. How is this diagnosed? This condition may be diagnosed based on:  A physical exam.  Ultrasound.  Lab tests, such as blood tests, urine tests, or swabs for infection. How is this treated? Treatment for a miscarriage is sometimes not needed  if all the pregnancy tissue that was in the uterus comes out on its own, and there are no other problems such as infection or heavy bleeding. In other cases, this condition may be treated with:  Dilation and curettage (D&C). In this procedure, the cervix is stretched open and any remaining pregnancy tissue is removed from the lining of the uterus (endometrium).  Medicines. These may include: ? Antibiotic medicine, to treat infection. ? Medicine to help any remaining pregnancy tissue come out of the body. ? Medicine to reduce (contract) the size of the uterus. These medicines may be given if there is a lot of bleeding. If you have Rh-negative blood, you may be given an injection of a medicine called Rho(D) immune globulin. This medicine helps prevent problems with future pregnancies. Follow these instructions at home: Medicines  Take over-the-counter and prescription medicines only as told by your health care provider.  If you were prescribed antibiotic medicine, take it as told by your health care provider. Do not stop taking the antibiotic even if you start to feel better. Activity  Rest as told by your health care provider. Ask your health care provider what activities are safe for you.  Have someone help with home and family responsibilities during this time. General instructions  Monitor how much  tissue or blood clot material comes out of the vagina.  Do not have sex, douche, or put anything, such as tampons, in your vagina until your health care provider says it is okay.  To help you and your partner with the grieving process, talk with your health care provider or get counseling.  When you are ready, meet with your health care provider to discuss any important steps you should take for your health. Also, discuss steps you should take to have a healthy pregnancy in the future.  Keep all follow-up visits. This is important.   Where to find more information  The [employer]  of Obstetricians and Gynecologists: acog.org  [place] Department of Health and Cytogeneticist of Women's Health: http://hoffman.com/ Contact a health care provider if:  You have a fever or chills.  There is bad-smelling fluid coming from the vagina.  You have more bleeding instead of less.  Tissue or blood clots come out of your vagina. Get help right away if:  You have severe cramps or pain in your back or abdomen.  Heavy bleeding soaks through 2 large sanitary pads an hour for more than 2 hours.  You become light-headed or weak.  You faint.  You feel sad, and your sadness takes over your thoughts.  You think about hurting yourself. If you ever feel like you may hurt yourself or others, or have thoughts about taking your own life, get help right away. Go to your nearest emergency department or:  Call your local emergency services (911 in the [place]  Call a suicide crisis helpline, such as the National Suicide Prevention Lifeline at (724)259-4909. This is open 24 hours a day in the [place]  Text the Crisis Text Line at 586-630-2438 (in the [place] Summary  Most miscarriages happen in the first 3 months of pregnancy. Sometimes miscarriage happens before a woman knows that she is pregnant.  Follow instructions from your health care provider about medicines and activity.  To help you and your partner with grieving, talk with your health care provider or get counseling.  Keep all follow-up visits. This information is not intended to replace advice given to you by your health care provider. Make sure you discuss any questions you have with your health care provider. Document Revised: 07/27/2019 Document Reviewed: 07/27/2019 Elsevier Patient Education  2021 [employer].

## 2020-07-06 NOTE — MAU Provider Note (Signed)
History     CSN: [id number]  Arrival date and time: 07/06/20 1020   Event Date/Time   First Provider Initiated Contact with Patient 07/06/20 1055      Chief Complaint  Patient presents with  . Abdominal Pain  . Vaginal Bleeding   Ms. Martha Mccormick is a 35 y.o. G3P2002 at [redacted]w[redacted]d who presents to MAU for vaginal bleeding which began this morning. Patient reports she was getting ready for church and felt a gush of fluid. She went to the bathroom and saw some fluid and blood in her underwear with some cramping. Patient reports both the cramping and bleeding have improved since this morning and she did not wear a pad on her way to the hospital from home.  Patient's husband present for entire visit.  Blood Type? A Positive  Pt denies vaginal discharge/odor/itching. Pt denies N/V, abdominal pain, constipation, diarrhea, or urinary problems. Pt denies fever, chills, fatigue, sweating or changes in appetite. Pt denies SOB or chest pain. Pt denies dizziness, HA, light-headedness, weakness.   OB History    Gravida  3   Para  2   Term  2   Preterm      AB      Living  2     SAB      IAB      Ectopic      Multiple  0   Live Births  2           Past Medical History:  Diagnosis Date  . Medical history non-contributory     Past Surgical History:  Procedure Laterality Date  . CESAREAN SECTION N/A 03/12/2016   Procedure: CESAREAN SECTION;  Surgeon: Myna Hidalgo, DO;  Location: [facility];  Service: Obstetrics;  Laterality: N/A;  . CESAREAN SECTION N/A 01/11/2019   Procedure: CESAREAN SECTION;  Surgeon: Myna Hidalgo, DO;  Location: [facility];  Service: Obstetrics;  Laterality: N/A;  . WISDOM TOOTH EXTRACTION      Family History  Problem Relation Age of Onset  . Other Sister        carrier Alfonzo Feller  . Hyperlipidemia Mother   . Cancer Paternal Aunt        thyroid and pancreatic  . Diabetes Maternal Grandmother   . Cancer Paternal Grandfather         lymphoma  . Hypertension Father     Social History   Tobacco Use  . Smoking status: Never Smoker  . Smokeless tobacco: Never Used  Vaping Use  . Vaping Use: Never used  Substance Use Topics  . Alcohol use: No  . Drug use: No    Allergies: No Known Allergies  Medications Prior to Admission  Medication Sig Dispense Refill Last Dose  . acetaminophen (TYLENOL) 500 MG tablet 1-2 tablets every 6 hours. 30 tablet 1   . docusate sodium (COLACE) 100 MG capsule Take 1 capsule (100 mg total) by mouth 2 (two) times daily. (Patient not taking: Reported on 01/12/2019) 30 capsule 0   . ibuprofen (ADVIL) 600 MG tablet Take 1 tablet (600 mg total) by mouth every 6 (six) hours as needed for mild pain or moderate pain. 30 tablet 1   . oxyCODONE (OXY IR/ROXICODONE) 5 MG immediate release tablet Take 1 tablet (5 mg total) by mouth every 6 (six) hours as needed for moderate pain or breakthrough pain. 20 tablet 0   . prenatal vitamin w/FE, FA (PRENATAL 1 + 1) 27-1 MG TABS tablet Take 1 tablet  by mouth daily at 12 noon.       Review of Systems  Constitutional: Negative for chills, diaphoresis, fatigue and fever.  Eyes: Negative for visual disturbance.  Respiratory: Negative for shortness of breath.   Cardiovascular: Negative for chest pain.  Gastrointestinal: Negative for abdominal pain, constipation, diarrhea, nausea and vomiting.  Genitourinary: Positive for pelvic pain and vaginal bleeding. Negative for dysuria, flank pain, frequency, urgency and vaginal discharge.  Neurological: Negative for dizziness, weakness, light-headedness and headaches.   Physical Exam   Blood pressure 123/77, pulse 60, temperature 98.3 F (36.8 C), temperature source Oral, resp. rate 18, unknown if currently breastfeeding.  Patient Vitals for the past 24 hrs:  BP Temp Temp src Pulse Resp  07/06/20 1047 123/77 98.3 F (36.8 C) Oral 60 18   Physical Exam Vitals and nursing note reviewed.  Constitutional:       General: She is not in acute distress.    Appearance: Normal appearance. She is not ill-appearing, toxic-appearing or diaphoretic.  HENT:     Head: Normocephalic and atraumatic.  Pulmonary:     Effort: Pulmonary effort is normal.  Neurological:     Mental Status: She is alert and oriented to person, place, and time.  Psychiatric:        Mood and Affect: Mood normal.        Behavior: Behavior normal.        Thought Content: Thought content normal.        Judgment: Judgment normal.    Results for orders placed or performed during the hospital encounter of 07/06/20 (from the past 24 hour(s))  Urinalysis, Routine w reflex microscopic Urine, Clean Catch     Status: Abnormal   Collection Time: 07/06/20 10:31 AM  Result Value Ref Range   Color, Urine STRAW (A) YELLOW   APPearance CLEAR CLEAR   Specific Gravity, Urine 1.004 (L) 1.005 - 1.030   pH 5.0 5.0 - 8.0   Glucose, UA NEGATIVE NEGATIVE mg/dL   Hgb urine dipstick SMALL (A) NEGATIVE   Bilirubin Urine NEGATIVE NEGATIVE   Ketones, ur NEGATIVE NEGATIVE mg/dL   Protein, ur NEGATIVE NEGATIVE mg/dL   Nitrite NEGATIVE NEGATIVE   Leukocytes,Ua NEGATIVE NEGATIVE   RBC / HPF 0-5 0 - 5 RBC/hpf   WBC, UA 0-5 0 - 5 WBC/hpf   Bacteria, UA RARE (A) NONE SEEN   Squamous Epithelial / LPF 0-5 0 - 5   Mucus PRESENT   Pregnancy, urine POC     Status: Abnormal   Collection Time: 07/06/20 10:34 AM  Result Value Ref Range   Preg Test, Ur POSITIVE (A) NEGATIVE   US OB LESS THAN 14 WEEKS WITH OB TRANSVAGINAL  Result Date: 07/06/2020 CLINICAL DATA:  Pregnant patient with heavy vaginal bleeding. Patient is 9 weeks and 4 days pregnant based on her last menstrual period. EXAM: OBSTETRIC <14 WK ULTRASOUND TECHNIQUE: Transabdominal ultrasound was performed for evaluation of the gestation as well as the maternal uterus and adnexal regions. COMPARISON:  None. FINDINGS: Intrauterine gestational sac: Single Yolk sac:  Visualized. Embryo:  Visualized. Cardiac  Activity: Yes. Heart Rate: One hundred seventy-six bpm CRL:   25.2 mm   9 w 1 d                  [place] EDC: 02/07/2021 Subchorionic hemorrhage: Large subchronic hemorrhage extending over the superior and anterior inferior aspect of the gestational sac. Maternal uterus/adnexae: Normal ovaries and adnexa. No pelvic free fluid. IMPRESSION: 1. Single live intrauterine  pregnancy with a measured gestational age of [redacted] weeks and 1 day, consistent with the expected gestational age based on the patient's last menstrual period. 2. Large subchronic hemorrhage.  No other pregnancy complication. Electronically Signed   By: Amie Portland M.D.   On: 07/06/2020 13:16    MAU Course  Procedures  MDM -pt with confirmed IUP at [facility] office earlier this week, no report available in Epic -after discussion, pt elects to proceed with [place] first and declines blood work and vaginal swabs initially, as pt reports she believes STD testing will be performed at her OB office with her NOB visit in the near future -[place]: single IUP, +yolk sac, +embryo, +FHR 176, large SCH, [redacted]w[redacted]d -ABO: A Positive -pt discharged to home in stable condition  Orders Placed This Encounter  Procedures  . US OB LESS THAN 14 WEEKS WITH OB TRANSVAGINAL    Standing Status:   Standing    Number of Occurrences:   1    Order Specific Question:   Symptom/Reason for Exam    Answer:   Vaginal bleeding in pregnancy [[id number]]  . Urinalysis, Routine w reflex microscopic Urine, Clean Catch    Standing Status:   Standing    Number of Occurrences:   1  . Pregnancy, urine POC    Standing Status:   Standing    Number of Occurrences:   1  . Discharge patient    Order Specific Question:   Discharge disposition    Answer:   01-Home or Self Care [1]    Order Specific Question:   Discharge patient date    Answer:   07/06/2020   No orders of the defined types were placed in this encounter.  Assessment and Plan   1. Subchorionic hemorrhage of placenta in first trimester,  single or unspecified fetus   2. Vaginal bleeding in pregnancy   3. Blood type, Rh positive     Allergies as of 07/06/2020   No Known Allergies     Medication List    STOP taking these medications   ibuprofen 600 MG tablet Commonly known as: ADVIL     TAKE these medications   acetaminophen 500 MG tablet Commonly known as: TYLENOL 1-2 tablets every 6 hours.   docusate sodium 100 MG capsule Commonly known as: Colace Take 1 capsule (100 mg total) by mouth 2 (two) times daily.   oxyCODONE 5 MG immediate release tablet Commonly known as: Oxy IR/ROXICODONE Take 1 tablet (5 mg total) by mouth every 6 (six) hours as needed for moderate pain or breakthrough pain.   prenatal vitamin w/FE, FA 27-1 MG Tabs tablet Take 1 tablet by mouth daily at 12 noon.      -discussed expected s/sx of [facility] -discussed SCH vs. SAB -return MAU precautions given -pt discharged to home in stable condition  Joni Reining E Davie Claud 07/06/2020, 1:57 PM

## 2020-07-11 LAB — OB RESULTS CONSOLE RPR: RPR: NONREACTIVE

## 2020-07-11 LAB — OB RESULTS CONSOLE GC/CHLAMYDIA
Chlamydia: NEGATIVE
Gonorrhea: NEGATIVE

## 2020-07-11 LAB — OB RESULTS CONSOLE ANTIBODY SCREEN: Antibody Screen: NEGATIVE

## 2020-07-11 LAB — HEPATITIS C ANTIBODY: HCV Ab: NEGATIVE

## 2020-07-11 LAB — OB RESULTS CONSOLE ABO/RH: RH Type: POSITIVE

## 2020-07-11 LAB — OB RESULTS CONSOLE RUBELLA ANTIBODY, IGM: Rubella: IMMUNE

## 2020-07-11 LAB — OB RESULTS CONSOLE HIV ANTIBODY (ROUTINE TESTING): HIV: NONREACTIVE

## 2020-07-11 LAB — OB RESULTS CONSOLE HEPATITIS B SURFACE ANTIGEN: Hepatitis B Surface Ag: NEGATIVE

## 2021-01-23 ENCOUNTER — Encounter (HOSPITAL_COMMUNITY): Payer: Self-pay

## 2021-01-23 ENCOUNTER — Telehealth (HOSPITAL_COMMUNITY): Payer: Self-pay | Admitting: *Deleted

## 2021-01-23 NOTE — Patient Instructions (Addendum)
Martha Mccormick  01/23/2021   Your procedure is scheduled on:  01/28/2021  Arrive at 2:00 PM at Entrance C on [address] at [facility]  and [employer]. You are invited to use the FREE valet parking or use the Visitor's parking deck.  Pick up the phone at the desk and dial [phone].  Call this number if you have problems the morning of surgery: [phone]  Remember:   Do not eat food:(After Midnight) Desps de medianoche.  Do not drink clear liquids: (6 Hours before arrival) 6 horas ante llegada.  Take these medicines the morning of surgery with A SIP OF WATER:  none   Do not wear jewelry, make-up or nail polish.  Do not wear lotions, powders, or perfumes. Do not wear deodorant.  Do not shave 48 hours prior to surgery.  Do not bring valuables to the hospital.  [facility] is not   responsible for any belongings or valuables brought to the hospital.  Contacts, dentures or bridgework may not be worn into surgery.  Leave suitcase in the car. After surgery it may be brought to your room.  For patients admitted to the hospital, checkout time is 11:00 AM the day of              discharge.      Please read over the following fact sheets that you were given:     Preparing for Surgery

## 2021-01-23 NOTE — H&P (Signed)
HPI: 35 y.o. G3P2002 @ [redacted]w[redacted]d estimated gestational age (as dated by LMP c/w 8 week ultrasound) presents for repeat cesarean section for history of cesarean section x 2.  Leakage of fluid:  No Vaginal bleeding:  No Contractions:  No Fetal movement:  Yes  Prenatal care has been provided by Dr. [name] [facility])  ROS:  Denies fevers, chills, chest pain, visual changes, SOB, RUQ/epigastric pain, N/V, dysuria, hematuria, or sudden onset/worsening bilateral LE or facial edema.  Pregnancy complicated by: Size less than dates (normal growth [place]) History of cesarean delivery x 2  Prenatal Transfer Tool  Maternal Diabetes: No Genetic Screening: Normal Maternal Ultrasounds/Referrals: Normal Fetal Ultrasounds or other Referrals:  None Maternal Substance Abuse:  No Significant Maternal Medications:  None Significant Maternal Lab Results: Group B Strep positive   Prenatal Labs Blood type:  A Positive Antibody screen:  Negative CBC:  H/H 12.4/36.5 Rubella: Immune RPR:  Non-reactive Hep B:  Negative Hep C:  Negative HIV:  Negative GC/CT:  Negative Glucola:  117.8 (wnl)  Immunizations: Tdap: Given prenatally Flu: Received  OBHx:  OB History     Gravida  3   Para  2   Term  2   Preterm      AB      Living  2      SAB      IAB      Ectopic      Multiple  0   Live Births  2          PMHx:  See above Meds:  PNV Allergy:  No Known Allergies SurgHx: Cesarean section x 2 SocHx:   Denies Tobacco, ETOH, illicit drugs  O: There were no vitals taken for this visit. Gen. AAOx3, NAD CV.  RRR  Resp. CTAB, no wheezes/rales/rhonchi Abd. Gravid, soft, non-tender throughout, no rebound/guarding Extr.  No bilateral LE edema, no calf tenderness bilaterally  Last growth [place] (12/13): EFW 2956g, 6lbs8oz (33%), AAFV, cephalic, posterior placenta  Labs: see orders  A/P:  35 y.o. [id number] @ 110w0d who presents for repeat cesarean section for history of cesarean  section x 2.  - Admit to L&D - Admit labs (CBC, T&S, COVID screen) - CEFM/Toco - Diet:  NPO - IVF:  Per routine - VTE Prophylaxis:  SCDs - GBS Status:  Positive - Presentation:  Cephalic on last [place] - Antibiotics:  Ancef 2g on call to OR - Shohl's on call to OR - Consents performed on 12/19  Consents: I have explained to the patient that this surgery is performed to deliver their baby or babies through an incision in the abdomen and incision in the uterus.  Prior to surgery, the risks and benefits of the surgery, as well as alternative treatments were discussed.  The risks include, but are not limited to, possible need for cesarean delivery for all future pregnancies, bleeding at the time of surgery that could necessitate a blood transfusion and/or hysterectomy, rupture of the uterus during a future pregnancy that could cause a preterm delivery and/or requiring hysterectomy, infection, damage to surrounding organs and tissues, damage to bladder, damage to ureters, causing kidney damage, and requiring additional procedures, damage to bowels, resulting in further surgery, postoperative pain, short-term and long-term, scarring on the abdominal wall and intra-abdominally, need for further surgery, development of an incisional hernia, deep vein thrombosis and/or pulmonary embolism, wound infection and/or separation, painful intercourse, urinary leakage, impact on future pregnancies including but not limited to, abnormal location or attachment of  the placenta to the uterus, such as placenta previa or accreta, that may necessitate a blood transfusion and/or hysterectomy, impact on total family size, complications the course of which cannot be predicted or prevented, and death. Patient was consented for blood products.  The patient is aware that bleeding may result in the need for a blood transfusion which includes risk of transmission of HIV (1:2 million), Hepatitis C (1:2 million), and Hepatitis B (1:200  thousand) and transfusion reaction.  Patient voiced understanding of the above risks as well as understanding of indications for blood transfusion.  Drema Dallas, DO [phone] (office)

## 2021-01-23 NOTE — Telephone Encounter (Signed)
Preadmission screen  

## 2021-01-25 ENCOUNTER — Inpatient Hospital Stay (EMERGENCY_DEPARTMENT_HOSPITAL)
Admission: AD | Admit: 2021-01-25 | Discharge: 2021-01-25 | Disposition: A | Discharge status: Home / Self Care | Payer: No Typology Code available for payment source | Source: Home / Self Care | Attending: Obstetrics and Gynecology | Admitting: Obstetrics and Gynecology

## 2021-01-25 DIAGNOSIS — Z3A38 38 weeks gestation of pregnancy: Secondary | ICD-10-CM | POA: Insufficient documentation

## 2021-01-25 DIAGNOSIS — O471 False labor at or after 37 completed weeks of gestation: Secondary | ICD-10-CM | POA: Insufficient documentation

## 2021-01-25 DIAGNOSIS — O479 False labor, unspecified: Secondary | ICD-10-CM | POA: Diagnosis not present

## 2021-01-25 DIAGNOSIS — O34211 Maternal care for low transverse scar from previous cesarean delivery: Secondary | ICD-10-CM | POA: Diagnosis not present

## 2021-01-25 NOTE — MAU Note (Signed)
..  Martha Mccormick is a 35 y.o. at [redacted]w[redacted]d here in MAU reporting: contractions since 0045 that are every 10 minutes. Denies vaginal bleeding or leaking of fluid. +FM  Pain score: 2/10 Vitals:   01/25/21 0351  BP: 122/83  Pulse: 69  Resp: 16  Temp: 98 F (36.7 C)  SpO2: 100%     FHT:130

## 2021-01-25 NOTE — MAU Provider Note (Signed)
S: Ms. Martha Mccormick is a 35 y.o. G3P2002 at [redacted]w[redacted]d  who presents to MAU today for labor evaluation.     Cervical exam by RN:  Dilation: 1 Effacement (%): 50 Cervical Position: Posterior Station: Ballotable Presentation: Vertex Exam by:: Lamont Snowball, RN  Fetal Monitoring: Baseline: 135 bpm Variability: Moderate  Accelerations: 15x15 Decelerations: None Contractions: Occasional   MDM Discussed patient with RN. NST reviewed.   A: SIUP at [redacted]w[redacted]d  False labor  P: Discharge home Labor precautions and kick counts included in AVS Patient may return to MAU as needed or when in labor   Jase Himmelberger, Victorino Dike I, NP 01/25/2021 6:04 AM

## 2021-01-26 ENCOUNTER — Other Ambulatory Visit: Payer: Self-pay | Admitting: Obstetrics and Gynecology

## 2021-01-26 ENCOUNTER — Other Ambulatory Visit: Payer: Self-pay

## 2021-01-26 ENCOUNTER — Other Ambulatory Visit (HOSPITAL_COMMUNITY)
Admission: RE | Admit: 2021-01-26 | Discharge: 2021-01-26 | Disposition: A | Discharge status: Home / Self Care | Payer: No Typology Code available for payment source | Source: Ambulatory Visit | Attending: Obstetrics and Gynecology | Admitting: Obstetrics and Gynecology

## 2021-01-26 DIAGNOSIS — Z3483 Encounter for supervision of other normal pregnancy, third trimester: Secondary | ICD-10-CM | POA: Diagnosis present

## 2021-01-26 DIAGNOSIS — Z3A00 Weeks of gestation of pregnancy not specified: Secondary | ICD-10-CM | POA: Insufficient documentation

## 2021-01-26 LAB — TYPE AND SCREEN
ABO/RH(D): A POS
Antibody Screen: NEGATIVE

## 2021-01-26 LAB — CBC
HCT: 37.5 % (ref 36.0–46.0)
Hemoglobin: 12.9 g/dL (ref 12.0–15.0)
MCH: 31.9 pg (ref 26.0–34.0)
MCHC: 34.4 g/dL (ref 30.0–36.0)
MCV: 92.8 fL (ref 80.0–100.0)
Platelets: 178 10*3/uL (ref 150–400)
RBC: 4.04 MIL/uL (ref 3.87–5.11)
RDW: 12.4 % (ref 11.5–15.5)
WBC: 7.5 10*3/uL (ref 4.0–10.5)
nRBC: 0 % (ref 0.0–0.2)

## 2021-01-27 LAB — RPR: RPR Ser Ql: NONREACTIVE

## 2021-01-27 LAB — SARS CORONAVIRUS 2 (TAT 6-24 HRS): SARS Coronavirus 2: NEGATIVE

## 2021-01-28 ENCOUNTER — Encounter (HOSPITAL_COMMUNITY): Payer: Self-pay | Admitting: Obstetrics and Gynecology

## 2021-01-28 ENCOUNTER — Other Ambulatory Visit: Payer: Self-pay

## 2021-01-28 ENCOUNTER — Inpatient Hospital Stay (HOSPITAL_COMMUNITY): Payer: No Typology Code available for payment source | Admitting: Anesthesiology

## 2021-01-28 ENCOUNTER — Inpatient Hospital Stay (HOSPITAL_COMMUNITY)
Admission: RE | Admit: 2021-01-28 | Discharge: 2021-01-30 | DRG: 787 | Disposition: A | Discharge status: Home / Self Care | Payer: No Typology Code available for payment source | Attending: Obstetrics and Gynecology | Admitting: Obstetrics and Gynecology

## 2021-01-28 ENCOUNTER — Encounter (HOSPITAL_COMMUNITY)
Admission: RE | Disposition: A | Discharge status: Home / Self Care | Payer: Self-pay | Source: Home / Self Care | Attending: Obstetrics and Gynecology

## 2021-01-28 DIAGNOSIS — Z3A39 39 weeks gestation of pregnancy: Secondary | ICD-10-CM | POA: Diagnosis not present

## 2021-01-28 DIAGNOSIS — O99824 Streptococcus B carrier state complicating childbirth: Secondary | ICD-10-CM | POA: Diagnosis present

## 2021-01-28 DIAGNOSIS — O34211 Maternal care for low transverse scar from previous cesarean delivery: Principal | ICD-10-CM | POA: Diagnosis present

## 2021-01-28 DIAGNOSIS — Z98891 History of uterine scar from previous surgery: Secondary | ICD-10-CM

## 2021-01-28 SURGERY — Surgical Case
Anesthesia: Spinal

## 2021-01-28 MED ORDER — OXYTOCIN-SODIUM CHLORIDE 30-0.9 UT/500ML-% IV SOLN: 2.5000 [IU]/h | INTRAVENOUS | Status: AC

## 2021-01-28 MED ORDER — MORPHINE SULFATE (PF) 0.5 MG/ML IJ SOLN
INTRAMUSCULAR | Status: DC | PRN
  Administered 2021-01-28: 150 ug via INTRATHECAL

## 2021-01-28 MED ORDER — IBUPROFEN 600 MG PO TABS
600.0000 mg | ORAL_TABLET | Freq: Four times a day (QID) | ORAL | Status: DC
  Administered 2021-01-29 – 2021-01-30 (×3): 600 mg via ORAL
  Filled 2021-01-28 (×3): qty 1

## 2021-01-28 MED ORDER — ORAL CARE MOUTH RINSE: 15.0000 mL | Freq: Once | OROMUCOSAL | Status: AC

## 2021-01-28 MED ORDER — ONDANSETRON HCL 4 MG/2ML IJ SOLN: 4.0000 mg | Freq: Three times a day (TID) | INTRAMUSCULAR | Status: DC | PRN

## 2021-01-28 MED ORDER — DIPHENHYDRAMINE HCL 25 MG PO CAPS: 25.0000 mg | ORAL_CAPSULE | Freq: Four times a day (QID) | ORAL | Status: DC | PRN

## 2021-01-28 MED ORDER — CEFAZOLIN SODIUM-DEXTROSE 2-4 GM/100ML-% IV SOLN
INTRAVENOUS | Status: AC
  Filled 2021-01-28: qty 100

## 2021-01-28 MED ORDER — DIPHENHYDRAMINE HCL 25 MG PO CAPS: 25.0000 mg | ORAL_CAPSULE | ORAL | Status: DC | PRN

## 2021-01-28 MED ORDER — OXYCODONE HCL 5 MG PO TABS
5.0000 mg | ORAL_TABLET | ORAL | Status: DC | PRN
  Administered 2021-01-29 – 2021-01-30 (×4): 5 mg via ORAL
  Filled 2021-01-28 (×4): qty 1

## 2021-01-28 MED ORDER — SOD CITRATE-CITRIC ACID 500-334 MG/5ML PO SOLN
30.0000 mL | Freq: Once | ORAL | Status: AC
  Administered 2021-01-28: 15:00:00 30 mL via ORAL

## 2021-01-28 MED ORDER — ACETAMINOPHEN 500 MG PO TABS
1000.0000 mg | ORAL_TABLET | Freq: Once | ORAL | Status: AC
  Administered 2021-01-28: 13:00:00 1000 mg via ORAL

## 2021-01-28 MED ORDER — SENNOSIDES-DOCUSATE SODIUM 8.6-50 MG PO TABS
2.0000 | ORAL_TABLET | Freq: Every day | ORAL | Status: DC
  Administered 2021-01-29 – 2021-01-30 (×2): 2 via ORAL
  Filled 2021-01-28 (×2): qty 2

## 2021-01-28 MED ORDER — ACETAMINOPHEN 500 MG PO TABS: 1000.0000 mg | ORAL_TABLET | Freq: Four times a day (QID) | ORAL | Status: DC

## 2021-01-28 MED ORDER — DEXAMETHASONE SODIUM PHOSPHATE 10 MG/ML IJ SOLN
INTRAMUSCULAR | Status: AC
  Filled 2021-01-28: qty 1

## 2021-01-28 MED ORDER — SIMETHICONE 80 MG PO CHEW
80.0000 mg | CHEWABLE_TABLET | Freq: Three times a day (TID) | ORAL | Status: DC
  Administered 2021-01-28 – 2021-01-30 (×5): 80 mg via ORAL
  Filled 2021-01-28 (×5): qty 1

## 2021-01-28 MED ORDER — CHLORHEXIDINE GLUCONATE 0.12 % MT SOLN
OROMUCOSAL | Status: AC
  Filled 2021-01-28: qty 15

## 2021-01-28 MED ORDER — ONDANSETRON HCL 4 MG/2ML IJ SOLN
INTRAMUSCULAR | Status: DC | PRN
  Administered 2021-01-28: 4 mg via INTRAVENOUS

## 2021-01-28 MED ORDER — OXYTOCIN-SODIUM CHLORIDE 30-0.9 UT/500ML-% IV SOLN
INTRAVENOUS | Status: AC
  Filled 2021-01-28: qty 500

## 2021-01-28 MED ORDER — ACETAMINOPHEN 500 MG PO TABS
ORAL_TABLET | ORAL | Status: AC
  Filled 2021-01-28: qty 2

## 2021-01-28 MED ORDER — ONDANSETRON HCL 4 MG/2ML IJ SOLN
INTRAMUSCULAR | Status: AC
  Filled 2021-01-28: qty 2

## 2021-01-28 MED ORDER — NALOXONE HCL 0.4 MG/ML IJ SOLN: 0.4000 mg | INTRAMUSCULAR | Status: DC | PRN

## 2021-01-28 MED ORDER — OXYTOCIN-SODIUM CHLORIDE 30-0.9 UT/500ML-% IV SOLN
INTRAVENOUS | Status: DC | PRN
  Administered 2021-01-28: 400 mL via INTRAVENOUS

## 2021-01-28 MED ORDER — MORPHINE SULFATE (PF) 2 MG/ML IV SOLN: 1.0000 mg | INTRAVENOUS | Status: DC | PRN

## 2021-01-28 MED ORDER — CEFAZOLIN SODIUM-DEXTROSE 2-4 GM/100ML-% IV SOLN
2.0000 g | INTRAVENOUS | Status: AC
  Administered 2021-01-28: 15:00:00 2 g via INTRAVENOUS

## 2021-01-28 MED ORDER — SCOPOLAMINE 1 MG/3DAYS TD PT72
1.0000 | MEDICATED_PATCH | Freq: Once | TRANSDERMAL | Status: DC
  Administered 2021-01-28: 13:00:00 1.5 mg via TRANSDERMAL

## 2021-01-28 MED ORDER — SOD CITRATE-CITRIC ACID 500-334 MG/5ML PO SOLN
ORAL | Status: AC
  Filled 2021-01-28: qty 30

## 2021-01-28 MED ORDER — KETOROLAC TROMETHAMINE 30 MG/ML IJ SOLN
INTRAMUSCULAR | Status: AC
  Filled 2021-01-28: qty 1

## 2021-01-28 MED ORDER — SCOPOLAMINE 1 MG/3DAYS TD PT72: 1.0000 | MEDICATED_PATCH | Freq: Once | TRANSDERMAL | Status: DC

## 2021-01-28 MED ORDER — LACTATED RINGERS IV SOLN: INTRAVENOUS | Status: DC

## 2021-01-28 MED ORDER — SIMETHICONE 80 MG PO CHEW: 80.0000 mg | CHEWABLE_TABLET | ORAL | Status: DC | PRN

## 2021-01-28 MED ORDER — KETOROLAC TROMETHAMINE 30 MG/ML IJ SOLN
30.0000 mg | Freq: Four times a day (QID) | INTRAMUSCULAR | Status: AC
  Administered 2021-01-28 – 2021-01-29 (×4): 30 mg via INTRAVENOUS
  Filled 2021-01-28 (×4): qty 1

## 2021-01-28 MED ORDER — SODIUM CHLORIDE 0.9% FLUSH: 3.0000 mL | INTRAVENOUS | Status: DC | PRN

## 2021-01-28 MED ORDER — PHENYLEPHRINE HCL-NACL 20-0.9 MG/250ML-% IV SOLN
INTRAVENOUS | Status: AC
  Filled 2021-01-28: qty 250

## 2021-01-28 MED ORDER — FAMOTIDINE 20 MG PO TABS
20.0000 mg | ORAL_TABLET | Freq: Once | ORAL | Status: AC
  Administered 2021-01-28: 13:00:00 20 mg via ORAL

## 2021-01-28 MED ORDER — PROMETHAZINE HCL 25 MG/ML IJ SOLN
6.2500 mg | INTRAMUSCULAR | Status: DC | PRN
Start: 2021-01-28 — End: 2021-01-28

## 2021-01-28 MED ORDER — COCONUT OIL OIL
1.0000 "application " | TOPICAL_OIL | Status: DC | PRN
  Administered 2021-01-29: 1 via TOPICAL

## 2021-01-28 MED ORDER — MENTHOL 3 MG MT LOZG: 1.0000 | LOZENGE | OROMUCOSAL | Status: DC | PRN

## 2021-01-28 MED ORDER — PRENATAL MULTIVITAMIN CH
1.0000 | ORAL_TABLET | Freq: Every day | ORAL | Status: DC
  Administered 2021-01-29 – 2021-01-30 (×2): 1 via ORAL
  Filled 2021-01-28 (×2): qty 1

## 2021-01-28 MED ORDER — FENTANYL CITRATE (PF) 100 MCG/2ML IJ SOLN
INTRAMUSCULAR | Status: AC
  Filled 2021-01-28: qty 2

## 2021-01-28 MED ORDER — FENTANYL CITRATE (PF) 100 MCG/2ML IJ SOLN: 25.0000 ug | INTRAMUSCULAR | Status: DC | PRN

## 2021-01-28 MED ORDER — DIBUCAINE (PERIANAL) 1 % EX OINT: 1.0000 "application " | TOPICAL_OINTMENT | CUTANEOUS | Status: DC | PRN

## 2021-01-28 MED ORDER — WITCH HAZEL-GLYCERIN EX PADS: 1.0000 "application " | MEDICATED_PAD | CUTANEOUS | Status: DC | PRN

## 2021-01-28 MED ORDER — FAMOTIDINE 20 MG PO TABS
ORAL_TABLET | ORAL | Status: AC
  Filled 2021-01-28: qty 1

## 2021-01-28 MED ORDER — BUPIVACAINE IN DEXTROSE 0.75-8.25 % IT SOLN
INTRATHECAL | Status: DC | PRN
  Administered 2021-01-28: 1.6 mL via INTRATHECAL

## 2021-01-28 MED ORDER — NALOXONE HCL 4 MG/10ML IJ SOLN
1.0000 ug/kg/h | INTRAVENOUS | Status: DC | PRN
  Filled 2021-01-28: qty 5

## 2021-01-28 MED ORDER — CHLORHEXIDINE GLUCONATE 0.12 % MT SOLN
15.0000 mL | Freq: Once | OROMUCOSAL | Status: AC
  Administered 2021-01-28: 13:00:00 15 mL via OROMUCOSAL

## 2021-01-28 MED ORDER — ZOLPIDEM TARTRATE 5 MG PO TABS: 5.0000 mg | ORAL_TABLET | Freq: Every evening | ORAL | Status: DC | PRN

## 2021-01-28 MED ORDER — MORPHINE SULFATE (PF) 0.5 MG/ML IJ SOLN
INTRAMUSCULAR | Status: AC
  Filled 2021-01-28: qty 10

## 2021-01-28 MED ORDER — DEXAMETHASONE SODIUM PHOSPHATE 10 MG/ML IJ SOLN
INTRAMUSCULAR | Status: DC | PRN
  Administered 2021-01-28: 10 mg via INTRAVENOUS

## 2021-01-28 MED ORDER — MEPERIDINE HCL 25 MG/ML IJ SOLN: 6.2500 mg | INTRAMUSCULAR | Status: DC | PRN

## 2021-01-28 MED ORDER — FENTANYL CITRATE (PF) 100 MCG/2ML IJ SOLN
INTRAMUSCULAR | Status: DC | PRN
  Administered 2021-01-28: 15 ug via INTRATHECAL

## 2021-01-28 MED ORDER — DIPHENHYDRAMINE HCL 50 MG/ML IJ SOLN: 12.5000 mg | INTRAMUSCULAR | Status: DC | PRN

## 2021-01-28 MED ORDER — KETOROLAC TROMETHAMINE 30 MG/ML IJ SOLN
30.0000 mg | Freq: Four times a day (QID) | INTRAMUSCULAR | Status: AC | PRN
  Administered 2021-01-28: 17:00:00 30 mg via INTRAVENOUS

## 2021-01-28 MED ORDER — ACETAMINOPHEN 500 MG PO TABS
1000.0000 mg | ORAL_TABLET | Freq: Four times a day (QID) | ORAL | Status: DC
  Administered 2021-01-28 – 2021-01-30 (×7): 1000 mg via ORAL
  Filled 2021-01-28 (×8): qty 2

## 2021-01-28 MED ORDER — SOD CITRATE-CITRIC ACID 500-334 MG/5ML PO SOLN: 30.0000 mL | ORAL | Status: DC

## 2021-01-28 MED ORDER — PHENYLEPHRINE HCL-NACL 20-0.9 MG/250ML-% IV SOLN
INTRAVENOUS | Status: DC | PRN
Start: 2021-01-28 — End: 2021-01-28
  Administered 2021-01-28: 60 ug/min via INTRAVENOUS

## 2021-01-28 MED ORDER — KETOROLAC TROMETHAMINE 30 MG/ML IJ SOLN: 30.0000 mg | Freq: Four times a day (QID) | INTRAMUSCULAR | Status: AC | PRN

## 2021-01-28 MED ORDER — SCOPOLAMINE 1 MG/3DAYS TD PT72
MEDICATED_PATCH | TRANSDERMAL | Status: AC
  Filled 2021-01-28: qty 1

## 2021-01-28 SURGICAL SUPPLY — 38 items
BENZOIN TINCTURE PRP APPL 2/3 (GAUZE/BANDAGES/DRESSINGS) ×3 IMPLANT
CHLORAPREP W/TINT 26 (MISCELLANEOUS) ×3 IMPLANT
CLAMP CORD UMBIL (MISCELLANEOUS) IMPLANT
CLOSURE WOUND 1/2 X4 (GAUZE/BANDAGES/DRESSINGS) ×1
CLOTH BEACON ORANGE TIMEOUT ST (SAFETY) ×3 IMPLANT
DRAPE C SECTION CLR SCREEN (DRAPES) ×3 IMPLANT
DRSG OPSITE POSTOP 4X10 (GAUZE/BANDAGES/DRESSINGS) ×3 IMPLANT
ELECT REM PT RETURN 9FT ADLT (ELECTROSURGICAL) ×3
ELECTRODE REM PT RTRN 9FT ADLT (ELECTROSURGICAL) ×1 IMPLANT
EXTRACTOR VACUUM KIWI (MISCELLANEOUS) IMPLANT
GLOVE BIOGEL PI IND STRL 7.0 (GLOVE) ×2 IMPLANT
GLOVE BIOGEL PI INDICATOR 7.0 (GLOVE) ×4
GLOVE SURG ENC MOIS LTX SZ6.5 (GLOVE) ×3 IMPLANT
GLOVE SURG POLYISO LF SZ6.5 (GLOVE) ×3 IMPLANT
GOWN STRL REUS W/ TWL LRG LVL3 (GOWN DISPOSABLE) ×3 IMPLANT
GOWN STRL REUS W/TWL LRG LVL3 (GOWN DISPOSABLE) ×6
KIT ABG SYR 3ML LUER SLIP (SYRINGE) IMPLANT
NDL HYPO 25X5/8 SAFETYGLIDE (NEEDLE) IMPLANT
NEEDLE HYPO 25X5/8 SAFETYGLIDE (NEEDLE) IMPLANT
NS IRRIG 1000ML POUR BTL (IV SOLUTION) ×3 IMPLANT
PACK C SECTION WH (CUSTOM PROCEDURE TRAY) ×3 IMPLANT
PAD OB MATERNITY 4.3X12.25 (PERSONAL CARE ITEMS) ×3 IMPLANT
PENCIL SMOKE EVAC W/HOLSTER (ELECTROSURGICAL) ×3 IMPLANT
RTRCTR C-SECT PINK 25CM LRG (MISCELLANEOUS) ×3 IMPLANT
SET BERKELEY SUCTION TUBING (SUCTIONS) ×2 IMPLANT
STRIP CLOSURE SKIN 1/2X4 (GAUZE/BANDAGES/DRESSINGS) ×2 IMPLANT
SUT MNCRL 0 VIOLET CTX 36 (SUTURE) ×2 IMPLANT
SUT MNCRL+ AB 3-0 CT1 36 (SUTURE) ×2 IMPLANT
SUT MONOCRYL 0 CTX 36 (SUTURE) ×4
SUT MONOCRYL AB 3-0 CT1 36IN (SUTURE) ×4
SUT PDS AB 0 CTX 36 PDP370T (SUTURE) ×6 IMPLANT
SUT PLAIN 0 NONE (SUTURE) IMPLANT
SUT VIC AB 2-0 CT1 27 (SUTURE)
SUT VIC AB 2-0 CT1 TAPERPNT 27 (SUTURE) IMPLANT
SUT VIC AB 4-0 KS 27 (SUTURE) ×3 IMPLANT
TOWEL OR 17X24 6PK STRL BLUE (TOWEL DISPOSABLE) ×6 IMPLANT
TRAY FOLEY W/BAG SLVR 14FR LF (SET/KITS/TRAYS/PACK) ×3 IMPLANT
WATER STERILE IRR 1000ML POUR (IV SOLUTION) ×3 IMPLANT

## 2021-01-28 NOTE — Anesthesia Procedure Notes (Signed)
Spinal  Patient location during procedure: OR Start time: 01/28/2021 2:58 PM End time: 01/28/2021 3:01 PM Reason for block: surgical anesthesia Staffing Performed: anesthesiologist  Anesthesiologist: Cecile Hearing, MD Preanesthetic Checklist Completed: patient identified, IV checked, risks and benefits discussed, surgical consent, monitors and equipment checked, pre-op evaluation and timeout performed Spinal Block Patient position: sitting Prep: DuraPrep and site prepped and draped Patient monitoring: continuous pulse ox and blood pressure Approach: midline Location: L3-4 Injection technique: single-shot Needle Needle type: Pencan  Needle gauge: 24 G Assessment Sensory level: T6 Events: CSF return Additional Notes Functioning IV was confirmed and monitors were applied. Sterile prep and drape, including hand hygiene, mask and sterile gloves were used. The patient was positioned and the spine was prepped. The skin was anesthetized with lidocaine.  Free flow of clear CSF was obtained prior to injecting local anesthetic into the CSF.  The spinal needle aspirated freely following injection.  The needle was carefully withdrawn.  The patient tolerated the procedure well. Consent was obtained prior to procedure with all questions answered and concerns addressed. Risks including but not limited to bleeding, infection, nerve damage, paralysis, failed block, inadequate analgesia, allergic reaction, high spinal, itching and headache were discussed and the patient wished to proceed.   Arrie Aran, MD

## 2021-01-28 NOTE — Progress Notes (Signed)
Dr. Desmond Lope at bedside. Reported that patient's heart rate has been between 37 to 42. 500 ml bolus lactated Ringers given per Dr. Desmond Lope.

## 2021-01-28 NOTE — Transfer of Care (Signed)
Immediate Anesthesia Transfer of Care Note  Patient: Martha Mccormick  Procedure(s) Performed: CESAREAN SECTION APPLICATION OF CELL SAVER  Patient Location: PACU  Anesthesia Type:Spinal  Level of Consciousness: awake  Airway & Oxygen Therapy: Patient Spontanous Breathing  Post-op Assessment: Report given to RN and Post -op Vital signs reviewed and stable  Post vital signs: Reviewed and stable  Last Vitals:  Vitals Value Taken Time  BP 102/69 01/28/21 1608  Temp    Pulse 54 01/28/21 1610  Resp 12 01/28/21 1610  SpO2 99 % 01/28/21 1610  Vitals shown include unvalidated device data.  Last Pain: There were no vitals filed for this visit.       Complications: No notable events documented.

## 2021-01-28 NOTE — Op Note (Signed)
Pre Op Dx:   1. Single live IUP at [redacted]w[redacted]d 2. History of cesarean section x 2  Post Op Dx:  Same as pre-operative diagnoses  Procedure:  Low Transverse Cesarean Section  Surgeon:  Dr. [name] Assistants:  Dr. Gerald Leitz Anesthesia:  Spinal  EBL:  250cc  IVF:  See anesthesia documentation UOP:  150cc  Drains:  Foley catheter  Specimen removed:  Placenta - sent to Labor and Delivery Device(s) implanted:  None Case Type:  Clean-contaminated Findings: Normal-appearing uterus, bilateral fallopian tubes, and ovaries. Mild fascial scarring. Fetus in cephalic position. Clear amniotic fluid. Complications: None Indications:  35 y.o. G3P2002 at [redacted]w[redacted]d with history of cesarean section x 2.  Procedure:  After informed consent was obtained, the patient was brought to the operating room.  Following administration of anesthesia, the patient was positioned in dorsal supine position with a leftward tilt and was prepped and draped in sterile fashion.  A preoperative time-out was performed.  The abdomen was entered in layers through a pfannenstiel incision and an Teacher, early years/pre was placed.  A low transverse hysterotomy was created sharply to the level of the membranes, then extended bluntly.  The fetus was delivered from cephalic presentation onto the field.  Bulb suctioning was performed.  The cord was doubly clamped and cut immediately due to placenta delivery immediately thereafter.  The newborn was passed to the warmer.  The placenta was delivered.  The uterus was swept free of clots and debris and closed in a running locked fashion with 0-Monocryl. A second imbricating layer was used to close the uterus using 0-Monocryl  Hemostasis was verified.  The abdomen was irrigated with warmed saline and cleared of clots. The peritoneum was closed in a running fashion with 2-0 Vicryl.  Subfascial spaces were inspected and hemostasis assured.  The fascia was closed in a running fashion with 0-PDS.  The  subcutaneous tissues were irrigated and hemostasis assured.  The subcutaneous tissues were closed with 3-0 Monocryl.  The skin was closed with 4-0 Vicryl.  A sterile bandage was applied.  The patient was transferred to PACU.  All needle, sponge, and instrument counts were correct at the end of the case.   Disposition:  PACU  I performed the procedure and the assistant was needed due to the complexity of the anatomy.  [name], DO

## 2021-01-28 NOTE — Anesthesia Preprocedure Evaluation (Signed)
Anesthesia Evaluation  Patient identified by MRN, date of birth, ID band Patient awake    Reviewed: Allergy & Precautions, NPO status , Patient's Chart, lab work & pertinent test results  Airway Mallampati: I  TM Distance: >3 FB Neck ROM: Full    Dental  (+) Teeth Intact, Dental Advisory Given   Pulmonary neg pulmonary ROS,    Pulmonary exam normal breath sounds clear to auscultation       Cardiovascular Exercise Tolerance: Good negative cardio ROS Normal cardiovascular exam Rhythm:Regular Rate:Normal     Neuro/Psych negative neurological ROS  negative psych ROS   GI/Hepatic negative GI ROS, Neg liver ROS,   Endo/Other  negative endocrine ROS  Renal/GU negative Renal ROS     Musculoskeletal negative musculoskeletal ROS (+)   Abdominal   Peds  Hematology negative hematology ROS (+)   Anesthesia Other Findings Day of surgery medications reviewed with the patient.  Reproductive/Obstetrics H/o c-section x2                             Anesthesia Physical Anesthesia Plan  ASA: 2  Anesthesia Plan: Spinal   Post-op Pain Management: Tylenol PO (pre-op)   Induction: Intravenous  PONV Risk Score and Plan: 2 and Scopolamine patch - Pre-op, Dexamethasone and Ondansetron  Airway Management Planned: Natural Airway  Additional Equipment:   Intra-op Plan:   Post-operative Plan:   Informed Consent: I have reviewed the patients History and Physical, chart, labs and discussed the procedure including the risks, benefits and alternatives for the proposed anesthesia with the patient or authorized representative who has indicated his/her understanding and acceptance.     Dental advisory given  Plan Discussed with: CRNA  Anesthesia Plan Comments:         Anesthesia Quick Evaluation

## 2021-01-28 NOTE — Interval H&P Note (Signed)
History and Physical Interval Note:  01/28/2021 2:48 PM  Martha Mccormick  has presented today for surgery, with the diagnosis of c-section.  The various methods of treatment have been discussed with the patient and family. After consideration of risks, benefits and other options for treatment, the patient has consented to  Procedure(s): CESAREAN SECTION (N/A) APPLICATION OF CELL SAVER (N/A) as a surgical intervention.  The patient's history has been reviewed, patient examined, no change in status, stable for surgery.  I have reviewed the patient's chart and labs.  Questions were answered to the patient's satisfaction.     [name]

## 2021-01-29 ENCOUNTER — Encounter (HOSPITAL_COMMUNITY): Payer: Self-pay | Admitting: Obstetrics and Gynecology

## 2021-01-29 ENCOUNTER — Inpatient Hospital Stay (HOSPITAL_COMMUNITY): Payer: No Typology Code available for payment source

## 2021-01-29 LAB — CBC
HCT: 30.1 % — ABNORMAL LOW (ref 36.0–46.0)
HCT: 26.6 % — ABNORMAL LOW (ref 36.0–46.0)
Hemoglobin: 10.1 g/dL — ABNORMAL LOW (ref 12.0–15.0)
Hemoglobin: 9.1 g/dL — ABNORMAL LOW (ref 12.0–15.0)
MCH: 31.6 pg (ref 26.0–34.0)
MCH: 31.6 pg (ref 26.0–34.0)
MCHC: 33.6 g/dL (ref 30.0–36.0)
MCHC: 34.2 g/dL (ref 30.0–36.0)
MCV: 94.1 fL (ref 80.0–100.0)
MCV: 92.4 fL (ref 80.0–100.0)
Platelets: 162 10*3/uL (ref 150–400)
Platelets: 136 10*3/uL — ABNORMAL LOW (ref 150–400)
RBC: 3.2 MIL/uL — ABNORMAL LOW (ref 3.87–5.11)
RBC: 2.88 MIL/uL — ABNORMAL LOW (ref 3.87–5.11)
RDW: 12.5 % (ref 11.5–15.5)
RDW: 12.3 % (ref 11.5–15.5)
WBC: 11.4 10*3/uL — ABNORMAL HIGH (ref 4.0–10.5)
WBC: 10.7 10*3/uL — ABNORMAL HIGH (ref 4.0–10.5)
nRBC: 0 % (ref 0.0–0.2)
nRBC: 0 % (ref 0.0–0.2)

## 2021-01-29 MED ORDER — LACTATED RINGERS IV SOLN: INTRAVENOUS | Status: DC

## 2021-01-29 MED ORDER — IBUPROFEN 600 MG PO TABS
600.0000 mg | ORAL_TABLET | Freq: Four times a day (QID) | ORAL | 0 refills | Status: AC
Start: 2021-01-30 — End: ?

## 2021-01-29 MED ORDER — METHYLERGONOVINE MALEATE 0.2 MG PO TABS
0.2000 mg | ORAL_TABLET | Freq: Four times a day (QID) | ORAL | Status: DC
  Administered 2021-01-29 – 2021-01-30 (×3): 0.2 mg via ORAL
  Filled 2021-01-29 (×3): qty 1

## 2021-01-29 MED ORDER — TRANEXAMIC ACID-NACL 1000-0.7 MG/100ML-% IV SOLN
1000.0000 mg | Freq: Once | INTRAVENOUS | Status: AC
  Administered 2021-01-29: 12:00:00 1000 mg via INTRAVENOUS

## 2021-01-29 MED ORDER — FERROUS SULFATE 325 (65 FE) MG PO TABS
325.0000 mg | ORAL_TABLET | Freq: Every day | ORAL | Status: DC
  Administered 2021-01-30: 10:00:00 325 mg via ORAL
  Filled 2021-01-29: qty 1

## 2021-01-29 MED ORDER — TRANEXAMIC ACID-NACL 1000-0.7 MG/100ML-% IV SOLN
INTRAVENOUS | Status: AC
  Filled 2021-01-29: qty 100

## 2021-01-29 MED ORDER — OXYCODONE HCL 5 MG PO TABS: 5.0000 mg | ORAL_TABLET | Freq: Four times a day (QID) | ORAL | 0 refills | Status: AC | PRN

## 2021-01-29 MED ORDER — FERROUS SULFATE 325 (65 FE) MG PO TBEC: 325.0000 mg | DELAYED_RELEASE_TABLET | Freq: Two times a day (BID) | ORAL | 3 refills | Status: AC

## 2021-01-29 MED ORDER — LACTATED RINGERS IV BOLUS: 500.0000 mL | Freq: Once | INTRAVENOUS | Status: DC

## 2021-01-29 MED ORDER — METHYLERGONOVINE MALEATE 0.2 MG/ML IJ SOLN
INTRAMUSCULAR | Status: AC
  Filled 2021-01-29: qty 1

## 2021-01-29 MED ORDER — METHYLERGONOVINE MALEATE 0.2 MG/ML IJ SOLN
0.2000 mg | Freq: Once | INTRAMUSCULAR | Status: AC
  Administered 2021-01-29: 12:00:00 0.2 mg via INTRAMUSCULAR

## 2021-01-29 MED ORDER — DOCUSATE SODIUM 100 MG PO CAPS: 100.0000 mg | ORAL_CAPSULE | Freq: Two times a day (BID) | ORAL | 3 refills | Status: AC

## 2021-01-29 NOTE — Lactation Note (Signed)
This note was copied from a baby's chart. Lactation Consultation Note Mom awake, baby and FOB sleeping. Mom stated BF going well so far. Mom has 33 yr old that she BF for 1 yr and 5 months to her 33 yr old that  had a tongue tie and she didn't transfer well from the breast so mom pumped and gave back to baby. Mom has good everted nipples. Mom would like for an LC to see mom latch baby to check make sure its good or not. Lactation brochure given. Mom will call today for assistance.  Patient Name: Boy Jazzlynn Rawe [id number] Date: 01/29/2021 Reason for consult: Initial assessment;Term Age:63 hours  Maternal Data Has patient been taught Hand Expression?: Yes Does the patient have breastfeeding experience prior to this delivery?: Yes How long did the patient breastfeed?: 1 yr to her 33 yr old and 5 months to her 33 yr old.  Feeding    LATCH Score       Type of Nipple: Everted at rest and after stimulation            Lactation Tools Discussed/Used    Interventions Interventions: Breast feeding basics reviewed  Discharge    Consult Status Consult Status: Follow-up Date: 01/29/21 Follow-up type: In-patient    Charyl Dancer 01/29/2021, 3:18 AM

## 2021-01-29 NOTE — Progress Notes (Signed)
In to re-evaluate patient. Resting in bed. Reports minimal lochia and no passage of any blood clots. Bedside US performed - uterus appears consistent with postpartum state with clean uterine stripe, mild air seen within endometrium, no blood clots or active bleeding noted. Fundus firm and at U-2. Continue Methergine x 24 hours. Repeat CBC in AM.   [name], DO

## 2021-01-29 NOTE — Progress Notes (Addendum)
Patient up to the bathroom at 1115 and passed 5 to 6 large grape sized clots into the toilet. Bleeding small to moderate. Assisted patient to bed and fundus was firm, midline and U-2, bleeding WNL and no clots. Informed patient to ring call bell if she felt her bleeding increase.

## 2021-01-29 NOTE — Plan of Care (Signed)
Problem: Education: Goal: Knowledge of General Education information will improve Description: Including pain rating scale, medication(s)/side effects and non-pharmacologic comfort measures Outcome: Completed/Met   Problem: Clinical Measurements: Goal: Ability to maintain clinical measurements within normal limits will improve Outcome: Completed/Met Goal: Will remain free from infection Outcome: Completed/Met Goal: Diagnostic test results will improve Outcome: Completed/Met Goal: Respiratory complications will improve Outcome: Completed/Met Goal: Cardiovascular complication will be avoided Outcome: Completed/Met   Problem: Activity: Goal: Risk for activity intolerance will decrease Outcome: Completed/Met   Problem: Nutrition: Goal: Adequate nutrition will be maintained Outcome: Completed/Met   Problem: Coping: Goal: Level of anxiety will decrease Outcome: Completed/Met   Problem: Elimination: Goal: Will not experience complications related to bowel motility Outcome: Completed/Met Goal: Will not experience complications related to urinary retention Outcome: Completed/Met   Problem: Pain Managment: Goal: General experience of comfort will improve Outcome: Completed/Met   Problem: Safety: Goal: Ability to remain free from injury will improve Outcome: Completed/Met   Problem: Education: Goal: Knowledge of condition will improve Outcome: Completed/Met   Problem: Activity: Goal: Will verbalize the importance of balancing activity with adequate rest periods Outcome: Completed/Met Goal: Ability to tolerate increased activity will improve Outcome: Completed/Met   Problem: Coping: Goal: Ability to identify and utilize available resources and services will improve Outcome: Completed/Met   Problem: Life Cycle: Goal: Chance of risk for complications during the postpartum period will decrease Outcome: Completed/Met   Problem: Role Relationship: Goal: Ability to  demonstrate positive interaction with newborn will improve Outcome: Completed/Met

## 2021-01-29 NOTE — Progress Notes (Signed)
Patient called for RN felt her bleeding had increased. When assessed patient passed numerous clots and and bleeding was moderate. Notified Dr. Connye Burkitt and orders were written. Vital signs WNL. Methergine 0.2 mg IM and TXA IV given per orders.

## 2021-01-29 NOTE — Progress Notes (Signed)
In for evaluation of postpartum hemorrhage. At the time of my arrival, beside [place] being performed to rule out retained products of conception. It appeared on ultrasound that there is a significant blood clot within the uterus and visible movement present - final read pending.  Patient is asymptomatic. No current active bleeding. Vital signs reviewed and at her baseline. Fundus firm and at U-2. S/p TXA 1g and Methergine 0.2mg  IM. Patient assisted to bathroom and blood clot expelled. Upon return to bed, fundus still firm and at U-2. Scant bleeding noted on pad. Current total QBL 701 from recent episode. Total QBL since delivery 951cc.  STAT CBC reviewed - Hgb 9.1 (from 10.1 this AM). Starting Hgb 12.9 prior to CS. AM CBC ordered. Methergine 0.2mg  PO q6h x 24h ordered. After patient nurses infant, will return with bedside US to re-evaluate uterus.  [name], DO

## 2021-01-29 NOTE — Anesthesia Postprocedure Evaluation (Signed)
Anesthesia Post Note  Patient: Martha Mccormick  Procedure(s) Performed: CESAREAN SECTION APPLICATION OF CELL SAVER     Patient location during evaluation: PACU Anesthesia Type: Spinal Level of consciousness: oriented, awake and alert and awake Pain management: pain level controlled Vital Signs Assessment: post-procedure vital signs reviewed and stable Respiratory status: spontaneous breathing, respiratory function stable and nonlabored ventilation Cardiovascular status: blood pressure returned to baseline, stable and bradycardic Postop Assessment: no headache, no backache, no apparent nausea or vomiting, spinal receding and patient able to bend at knees Anesthetic complications: no   No notable events documented.  Last Vitals:  Vitals:   01/29/21 1711 01/29/21 1955  BP: 95/61 (!) 92/52  Pulse: (!) 48 (!) 52  Resp:  18  Temp:  36.5 C  SpO2: 99%     Last Pain:  Vitals:   01/29/21 1955  TempSrc: Oral  PainSc: 1    Pain Goal:                   Cecile Hearing

## 2021-01-29 NOTE — Progress Notes (Addendum)
Postpartum Note Day #1  S:  Patient doing well.  Pain controlled.  Tolerating regular diet.   Ambulating and voiding without difficulty.   Denies fevers, chills, chest pain, SOB, N/V, or worsening bilateral LE edema.  Lochia: Minimal Infant feeding:  Breast Circumcision:  Desires prior to discharge Contraception:  None  O: Temp:  [97.5 F (36.4 C)-98.2 F (36.8 C)] 98.2 F (36.8 C) (12/22 0340) Pulse Rate:  [40-87] 87 (12/21 2330) Resp:  [11-24] 18 (12/22 0340) BP: (88-107)/(46-96) 91/54 (12/22 0340) SpO2:  [98 %-100 %] 99 % (12/22 0344) Weight:  [64 kg] 64 kg (12/21 1309) Gen: NAD, pleasant and cooperative CV: RRR Resp: CTAB, no wheezes/rales/rhonchi Abdomen: soft, non-distended, non-tender throughout Uterus: firm, non-tender, below umbilicus Incision: c/d/i, bandage in place  Ext: No bilateral LE edema, no bilateral calf tenderness, SCDs on and working  Labs:  Recent Labs    01/26/21 1039 01/29/21 0455  HGB 12.9 10.1*    A/P: Patient is a 35 y.o. [id number] POD#1 s/p repeat LTCS  - Pain well controlled  - GU: UOP is adequate - GI: Tolerating regular diet - Activity: encouraged sitting up to chair and ambulation as tolerated - DVT Prophylaxis: Frequent ambulation, SCDs in bed - Labs: as above  Circumcision consent: Routine circumcisions performed on newborns have been identified as voluntary, elective procedures by [employer] such as the [employer] [facility].  It is considered an elective procedure with no definitive medical indication and carries risks.  Risks include but are not limited to bleeding, infection, damage to penis with possible need for further surgery, poor cosmesis, and local anesthetic risks.  Circumcision will only be performed if patient is deemed to have normal anatomy by his Pediatrician, meets adequate criteria for a newborn of similar gestational age after birth and is without infection or other medical issue contraindicating  an elective procedure.   Patient understands and agrees with above consent Patient discussed with parents of infant   Disposition:  D/C home POD#2-3   [name], DO [phone] (office)

## 2021-01-29 NOTE — Lactation Note (Signed)
This note was copied from a baby's chart. Lactation Consultation Note  Patient Name: Martha Mccormick BDHDI'X Date: 01/29/2021 Reason for consult: Follow-up assessment;Term Age:33 hours   Lactation Follow Up Consult:  RN assessing mother and baby when I arrived.  Offered to assist with latching and mother receptive.  Reviewed breast feeding basics and assisted "[name]" to latch easily in the cross cradle position.  Discussed hand positioning and body alignment.  Mother appeared very comfortable with supporting him in this hold.  Observed him feeding well for 10 minutes prior to exiting the room.  He will be having a circumcision this afternoon.  Reviewed the possibility of sleepiness after the circumcision; "[name]" may cluster feed tonight.  Mother interested in being discharged tomorrow.  Encouraged to call for latch assistance as needed.  Mother provided with comfort gels per her request, however, her nipples are intact and without trauma.  Father present.   Maternal Data Has patient been taught Hand Expression?: Yes Does the patient have breastfeeding experience prior to this delivery?: Yes How long did the patient breastfeed?: 1 year with her first child and 5 months with her second child  Feeding Mother's Current Feeding Choice: Breast Milk  LATCH Score Latch: Grasps breast easily, tongue down, lips flanged, rhythmical sucking.  Audible Swallowing: A few with stimulation  Type of Nipple: Everted at rest and after stimulation  Comfort (Breast/Nipple): Soft / non-tender  Hold (Positioning): Assistance needed to correctly position infant at breast and maintain latch.  LATCH Score: 8   Lactation Tools Discussed/Used Tools: Comfort gels (Mother requested)  Interventions Interventions: Breast feeding basics reviewed;Assisted with latch;Skin to skin;Breast massage;Hand express;Breast compression;Comfort gels;Position options;Support pillows;Adjust  position;Education  Discharge Pump: Personal Chiropractor) WIC Program: No  Consult Status Consult Status: Follow-up Date: 01/30/21 Follow-up type: In-patient    Dora Sims 01/29/2021, 8:39 AM

## 2021-01-29 NOTE — Progress Notes (Signed)
In for brief reassessment. Doing well. Just received first dose of PO Methergine. Resting in bed. Most recent measurement from pad was 27cc. Vitals reviewed and are stable. Will start PO iron tomorrow.  [name], DO

## 2021-01-30 LAB — CBC
HCT: 29.1 % — ABNORMAL LOW (ref 36.0–46.0)
Hemoglobin: 10 g/dL — ABNORMAL LOW (ref 12.0–15.0)
MCH: 32.1 pg (ref 26.0–34.0)
MCHC: 34.4 g/dL (ref 30.0–36.0)
MCV: 93.3 fL (ref 80.0–100.0)
Platelets: 163 10*3/uL (ref 150–400)
RBC: 3.12 MIL/uL — ABNORMAL LOW (ref 3.87–5.11)
RDW: 12.5 % (ref 11.5–15.5)
WBC: 11.4 10*3/uL — ABNORMAL HIGH (ref 4.0–10.5)
nRBC: 0 % (ref 0.0–0.2)

## 2021-01-30 NOTE — Discharge Summary (Signed)
Postpartum Discharge Summary  Date of Service updated 01/30/2021     Patient Name: Martha Mccormick DOB: 1985/12/22 MRN: [id number]  Date of admission: 01/28/2021 Delivery date:01/28/2021  Delivering provider: Drema Dallas  Date of discharge: 01/30/2021  Admitting diagnosis: History of cesarean delivery [Z98.891] Intrauterine pregnancy: [redacted]w[redacted]d    Secondary diagnosis:  Principal Problem:   History of cesarean delivery Postpartum hemorrhage  Additional problems: None    Discharge diagnosis: Term Pregnancy Delivered                                              Post partum procedures: None Augmentation: N/A Complications: None  Hospital course: Sceduled C/S   35y.o. yo G3P3003 at 33w0das admitted to the hospital 01/28/2021 for scheduled cesarean section with the following indication:Elective Repeat.Delivery details are as follows:  Membrane Rupture Time/Date: 3:19 PM ,01/28/2021   Delivery Method:C-Section, Low Transverse  Details of operation can be found in separate operative note.  Patient had an uncomplicated postpartum course.  She is ambulating, tolerating a regular diet, passing flatus, and urinating well. Patient is discharged home in stable condition on  01/30/21        Newborn Data: Birth date:01/28/2021  Birth time:3:19 PM  Gender:Female  Living status:Living  Apgars:8 ,9  Weight:3000 g     Magnesium Sulfate received: No BMZ received: No Rhophylac:N/A MMR:N/A T-DaP:Given prenatally Flu: N/A Transfusion:No  Physical exam  Vitals:   01/29/21 1254 01/29/21 1711 01/29/21 1955 01/30/21 0520  BP: 99/68 95/61 (!) 92/52 99/64  Pulse: (!) 45 (!) 48 (!) 52 63  Resp: 18  18 18   Temp: 98 F (36.7 C)  97.7 F (36.5 C) 98 F (36.7 C)  TempSrc: Oral  Oral Oral  SpO2: 100% 99%    Weight:      Height:       General: alert, cooperative, and no distress Lochia: appropriate Uterine Fundus: firm Incision: Healing well with no significant drainage DVT  Evaluation: No evidence of DVT seen on physical exam. Labs: Lab Results  Component Value Date   WBC 11.4 (H) 01/30/2021   HGB 10.0 (L) 01/30/2021   HCT 29.1 (L) 01/30/2021   MCV 93.3 01/30/2021   PLT 163 01/30/2021   No flowsheet data found. Edinburgh Score: Edinburgh Postnatal Depression Scale Screening Tool 01/29/2021  I have been able to laugh and see the funny side of things. (No Data)  I have looked forward with enjoyment to things. -  I have blamed myself unnecessarily when things went wrong. -  I have been anxious or worried for no good reason. -  I have felt scared or panicky for no good reason. -  Things have been getting on top of me. -  I have been so unhappy that I have had difficulty sleeping. -  I have felt sad or miserable. -  I have been so unhappy that I have been crying. -  The thought of harming myself has occurred to me. - Flavia Shipperostnatal Depression Scale Total -      After visit meds:  Allergies as of 01/30/2021   No Known Allergies      Medication List     TAKE these medications    docusate sodium 100 MG capsule Commonly known as: Colace Take 1 capsule (100 mg total) by mouth 2 (two) times daily.  ferrous sulfate 325 (65 FE) MG EC tablet Take 1 tablet (325 mg total) by mouth 2 (two) times daily with a meal.   ibuprofen 600 MG tablet Commonly known as: ADVIL Take 1 tablet (600 mg total) by mouth every 6 (six) hours.   oxyCODONE 5 MG immediate release tablet Commonly known as: Oxy IR/ROXICODONE Take 1-2 tablets (5-10 mg total) by mouth every 6 (six) hours as needed for moderate pain, severe pain or breakthrough pain.   prenatal vitamin w/FE, FA 27-1 MG Tabs tablet Take 1 tablet by mouth daily.         Discharge home in stable condition Infant Feeding: Breast Infant Disposition:home with mother Discharge instruction: per After Visit Summary and Postpartum booklet. Activity: Advance as tolerated. Pelvic rest for 6 weeks.   Diet: routine diet Anticipated Birth Control: Unsure Postpartum Appointment:2 weeks Additional Postpartum F/U:  None Future Appointments:No future appointments. Follow up Visit:  Follow-up Information     Drema Dallas, DO Follow up in 2 week(s).   Specialty: Obstetrics and Gynecology Why: Please keep your post-operative visits as previously scheduled. Contact information: [address] [place] [address] [phone]                     01/30/2021 Christophe Louis, MD

## 2021-01-30 NOTE — Lactation Note (Signed)
This note was copied from a baby's chart. Lactation Consultation Note  Patient Name: Martha Mccormick FIEPP'I Date: 01/30/2021 Reason for consult: Follow-up assessment Age:33 hours   Lactation Follow Up Consult:  Mother called for lactation assistance.  "Martha Mccormick" awake and ready to feed now.  Provided pillows for support and observed mother latching in the football hold to the left breast.  This has been the more difficult side for latching.  Few suggestions made during feeding and assisted "Martha Mccormick" to stay awake with gentle stimulation.  After a few minutes he began sucking with strong jaw extensions; intermittent swallows noted.  Mother was able to identify the swallows also.  Reminded parents to be sure "Martha Mccormick" is awake and active at the breast and sucking with a nutritive suck.  If he tires or pulls off, place him STS.  Mother will begin pumping after feedings and give back any EBM she obtains to "[name]."  She has good follow up support with a private Advertising copywriter.  Mother has a DEBP for home use.  Father present and supportive.  RN updated.  Maternal Data    Feeding Mother's Current Feeding Choice: Breast Milk  LATCH Score Latch: Repeated attempts needed to sustain latch, nipple held in mouth throughout feeding, stimulation needed to elicit sucking reflex.  Audible Swallowing: Spontaneous and intermittent  Type of Nipple: Everted at rest and after stimulation  Comfort (Breast/Nipple): Soft / non-tender  Hold (Positioning): Assistance needed to correctly position infant at breast and maintain latch.  LATCH Score: 8   Lactation Tools Discussed/Used    Interventions Interventions: Breast feeding basics reviewed;Assisted with latch;Skin to skin;Breast massage;Hand express;Breast compression;Comfort gels;Position options;Support pillows;Adjust position;Education  Discharge Discharge Education: Engorgement and breast care Pump: Personal WIC Program:  No  Consult Status Consult Status: Complete Date: 01/30/21 Follow-up type: Call as needed    Cledith Kamiya R Nikki Rusnak 01/30/2021, 11:05 AM

## 2021-01-30 NOTE — Lactation Note (Signed)
This note was copied from a baby's chart. Lactation Consultation Note  Patient Name: Boy Eura Mccauslin DJTTS'V Date: 01/30/2021 Reason for consult: Follow-up assessment;Term Age:33 hours   Lactation Follow Up Consult:  Mother had a PPH yesterday.  Baby has a 9% weight loss this morning.  "[name]" was asleep in father's arms when I arrived.  Mother reported some pain with latching.  Observed her feeding yesterday morning; reviewed breast feeding basics and "[name]" was latched well with no pain.  Suggested mother call me for a return observation with the next feeding for further assessment.  Mother interested.  Will provide more education as needed.  Suggested mother begin pumping after feedings when she gets home today to help ensure a good milk supply after her hemorrhage.  She will feed back any EBM she obtains to "[name]."  Mother also has a scheduled lactation consultant that she is planning to see on Monday or Tuesday of next week.  RN in room and aware of plan; will call when mother is ready to feed again.     Maternal Data    Feeding Mother's Current Feeding Choice: Breast Milk  LATCH Score                    Lactation Tools Discussed/Used    Interventions    Discharge Pump: Personal WIC Program: No  Consult Status Consult Status: Follow-up Date: 01/30/21 Follow-up type: In-patient    Dora Sims 01/30/2021, 9:38 AM

## 2021-02-04 MED FILL — Sodium Chloride Irrigation Soln 0.9%: Qty: 3000 | Status: AC

## 2021-02-04 MED FILL — Electrolyte-R (PH 7.4) Solution: INTRAVENOUS | Qty: 1000 | Status: AC

## 2021-02-10 ENCOUNTER — Telehealth (HOSPITAL_COMMUNITY): Payer: Self-pay

## 2021-02-10 NOTE — Telephone Encounter (Signed)
"  I'm doing great. Everything is going really well. I'm doing well physically. I'm feeling a lot better. I'm taking tylenol and motrin as needed for pain. My honeycomb dressing is off and the steri strips are still in place. No redness, no drainage, everything looks good. I have a Post op appointment on Friday." Patient has no questions or concerns about her healing.  "He's doing good. I've been working with a Advertising copywriter. I'm doing a combination of breastfeeding and pumping/bottle feeding. His weight is going up. He has a weight check tomorrow. He sleeps in a pack an play." RN reviewed ABC's of safe sleep with patient. Patient declines any questions or concerns about baby.  EPDS score is 0.  Marcelino Duster [facility] 02/11/2020,1400

## 2022-11-08 DIAGNOSIS — F4322 Adjustment disorder with anxiety: Secondary | ICD-10-CM | POA: Diagnosis not present

## 2022-11-22 DIAGNOSIS — L814 Other melanin hyperpigmentation: Secondary | ICD-10-CM | POA: Diagnosis not present

## 2022-11-22 DIAGNOSIS — D225 Melanocytic nevi of trunk: Secondary | ICD-10-CM | POA: Diagnosis not present

## 2022-11-22 DIAGNOSIS — L578 Other skin changes due to chronic exposure to nonionizing radiation: Secondary | ICD-10-CM | POA: Diagnosis not present

## 2022-11-22 DIAGNOSIS — L821 Other seborrheic keratosis: Secondary | ICD-10-CM | POA: Diagnosis not present

## 2022-12-13 IMAGING — US US OB < 14 WEEKS - US OB TV
1 series · 15 of 28 positions shown · non-contrast
Comparison: None.

CLINICAL DATA: Pregnant patient with heavy vaginal bleeding.
Patient is 9 weeks and 4 days pregnant based on her last menstrual
period.

EXAM:
OBSTETRIC <14 WK ULTRASOUND
TECHNIQUE: Transabdominal ultrasound was performed for evaluation of the
gestation as well as the maternal uterus and adnexal regions.

[Series 1: us ob < 14 weeks - us ob tv · 76 acquisitions, 15 frames shown]
[im 1/76]
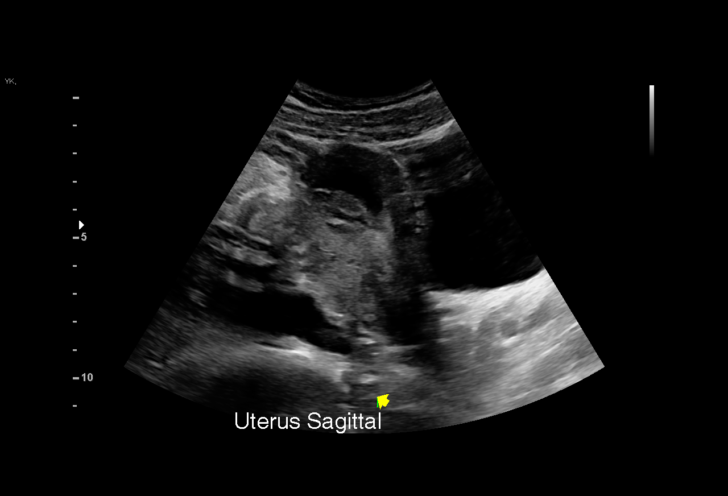
[im 6/76]
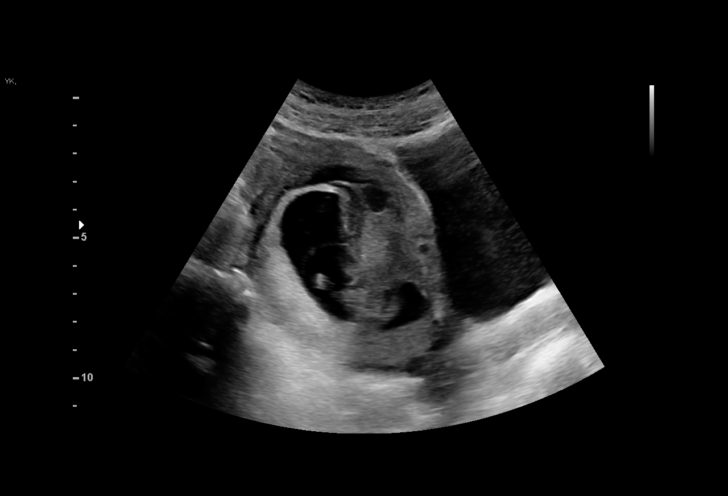
[im 12/76]
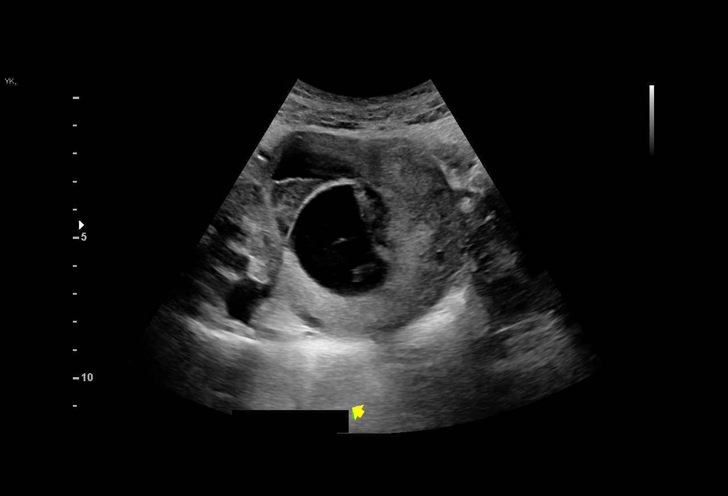
[im 17/76]
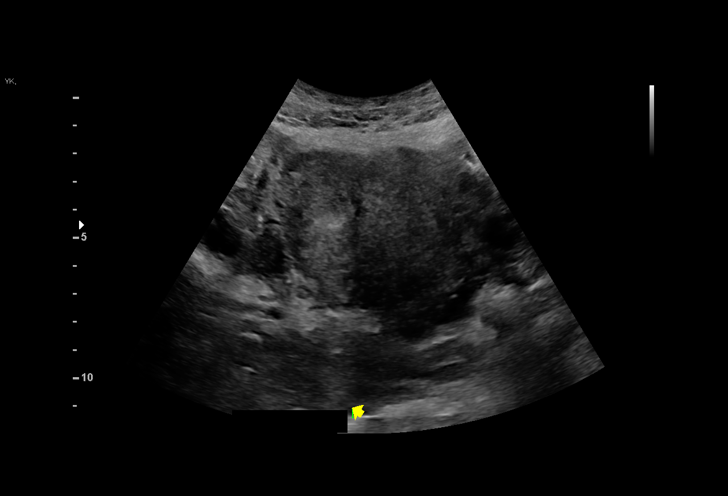
[im 23/76]
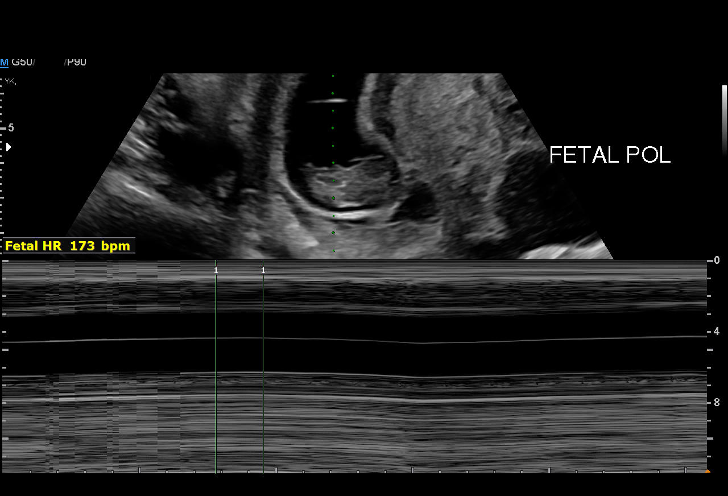
[im 28/76]
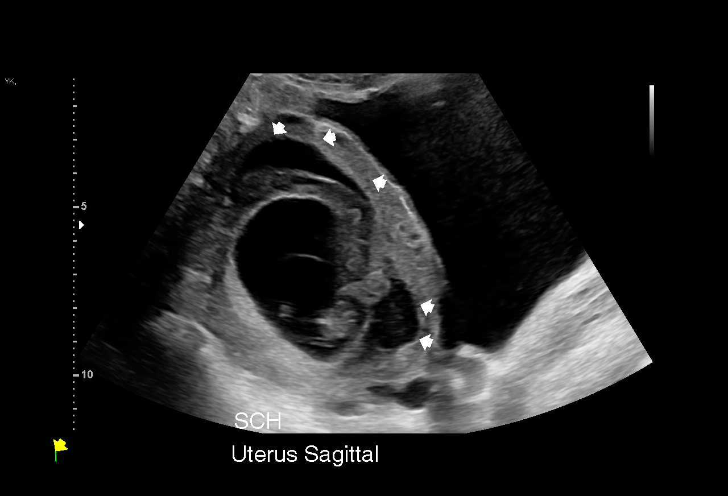
[im 34/76]
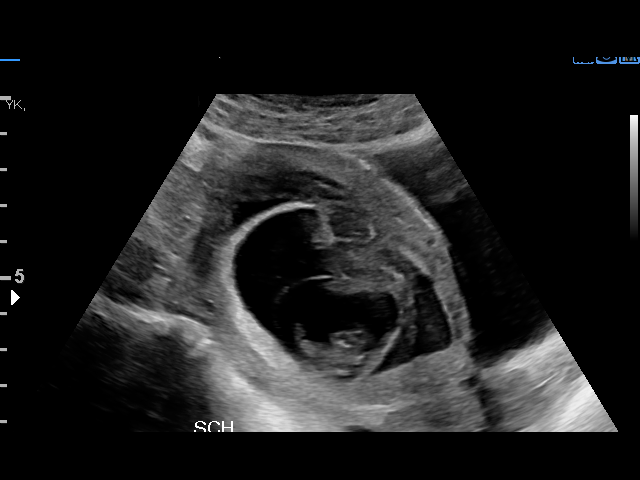
[im 39/76]
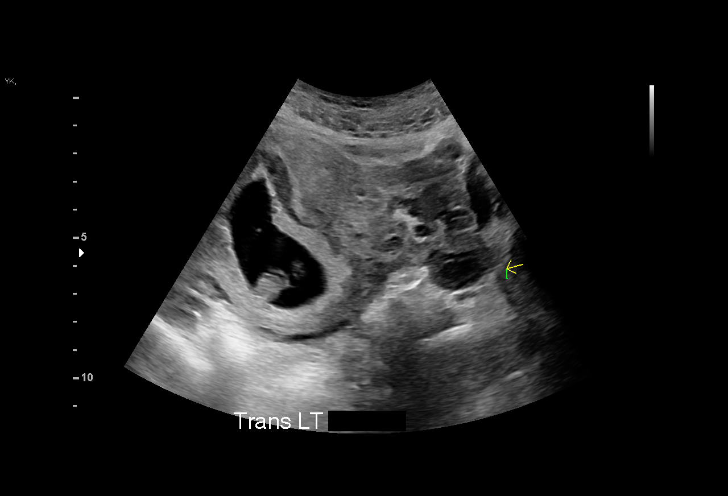
[im 42/76]
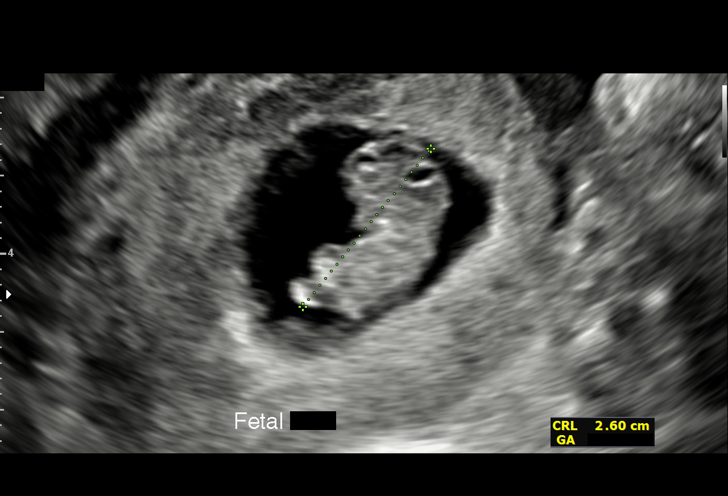
[im 48/76]
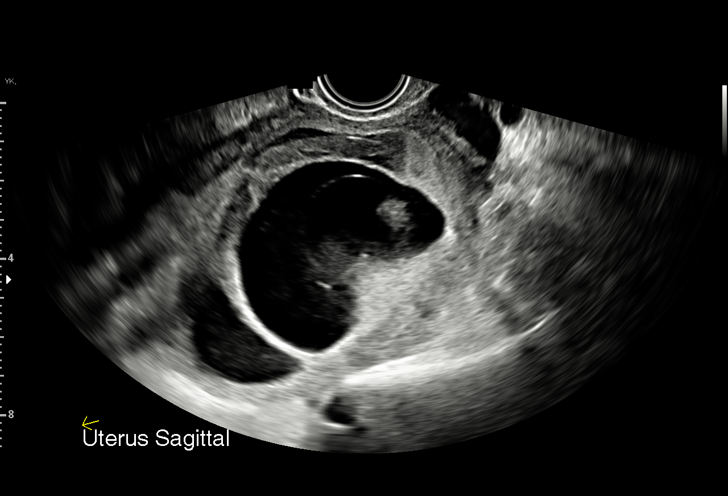
[im 53/76]
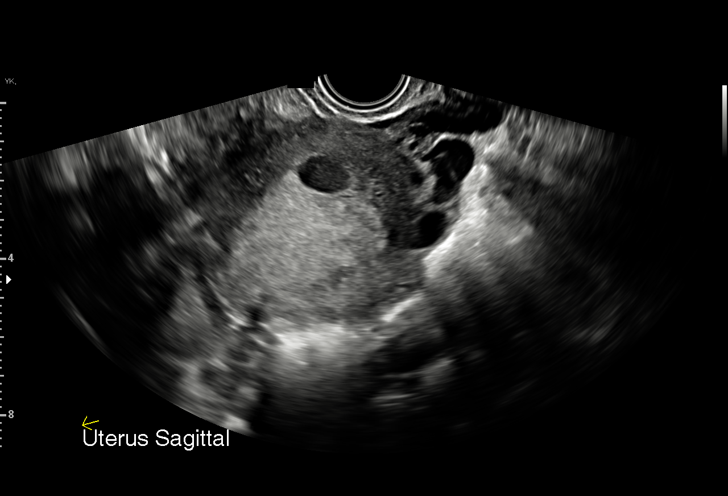
[im 59/76]
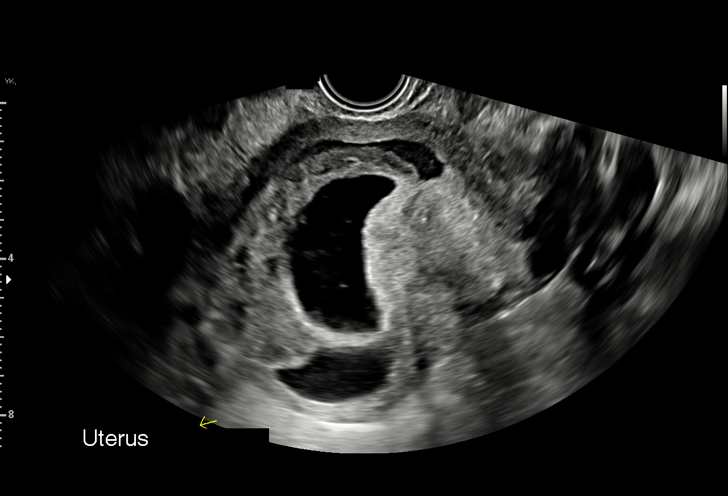
[im 64/76]
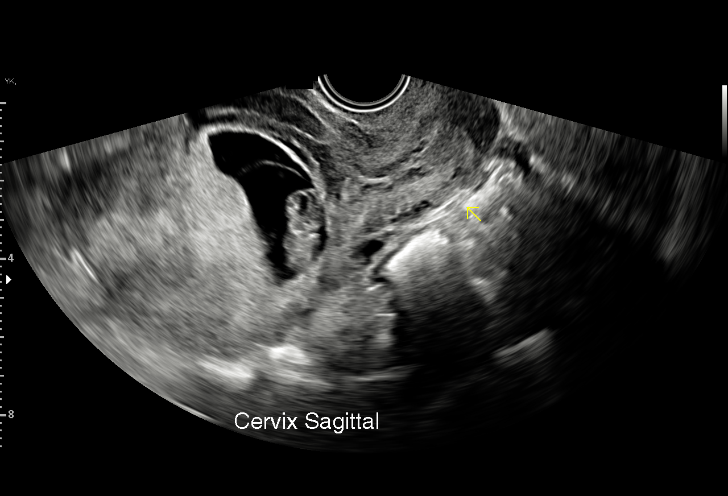
[im 70/76]
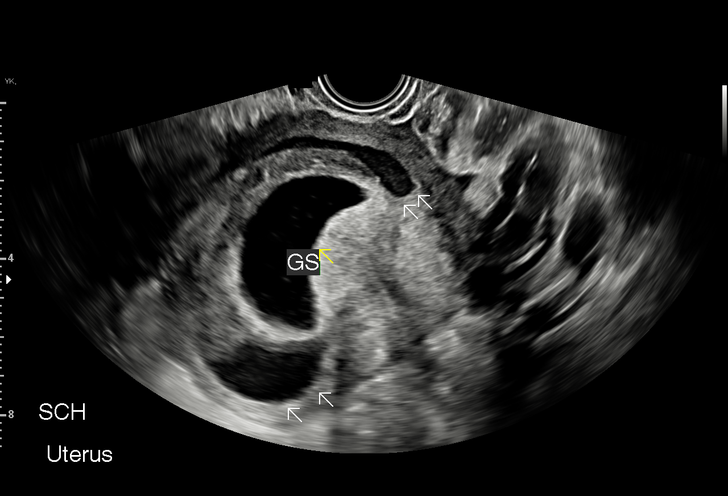
[im 76/76]
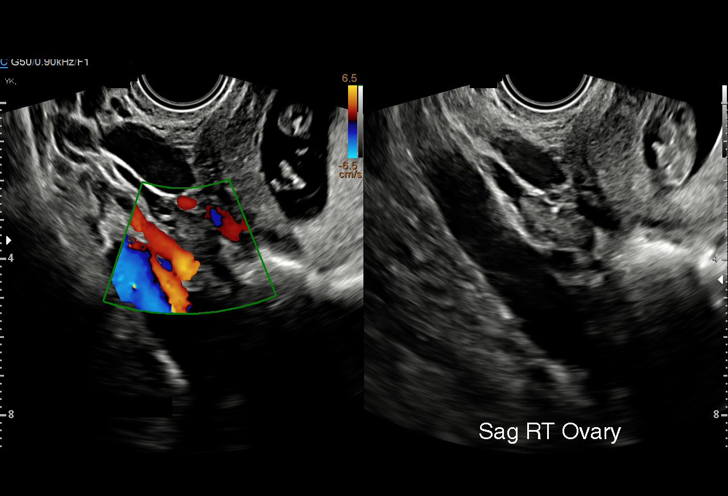

[15 of 28 positions shown; findings below may reference images not displayed]

FINDINGS: Intrauterine gestational sac: Single

Yolk sac:  Visualized.

Embryo:  Visualized.

Cardiac Activity: Yes.

Heart Rate: One hundred seventy-six bpm

CRL:   25.2 mm   9 w 1 d                  US EDC: 02/07/2021

Subchorionic hemorrhage: Large subchronic hemorrhage extending over
the superior and anterior inferior aspect of the gestational sac.

Maternal uterus/adnexae: Normal ovaries and adnexa. No pelvic free
fluid.
IMPRESSION: 1. Single live intrauterine pregnancy with a measured gestational
age of 9 weeks and 1 day, consistent with the expected gestational
age based on the patient's last menstrual period.
2. Large subchronic hemorrhage.  No other pregnancy complication.

## 2023-05-09 DIAGNOSIS — Z01419 Encounter for gynecological examination (general) (routine) without abnormal findings: Secondary | ICD-10-CM | POA: Diagnosis not present

## 2023-06-06 DIAGNOSIS — J029 Acute pharyngitis, unspecified: Secondary | ICD-10-CM | POA: Diagnosis not present

## 2023-07-08 IMAGING — US US PELVIS COMPLETE
2 series · 15 of 25 positions shown · non-contrast
Comparison: None.

CLINICAL DATA: Postpartum hemorrhage

EXAM:
TRANSABDOMINAL ULTRASOUND OF PELVIS
TECHNIQUE: Transabdominal ultrasound examination of the pelvis was performed
including evaluation of the uterus, ovaries, adnexal regions, and
pelvic cul-de-sac.

[Series 1: us pelvis complete · 33 acquisitions, 14 frames shown (1 of 2)]
[im 1/33]
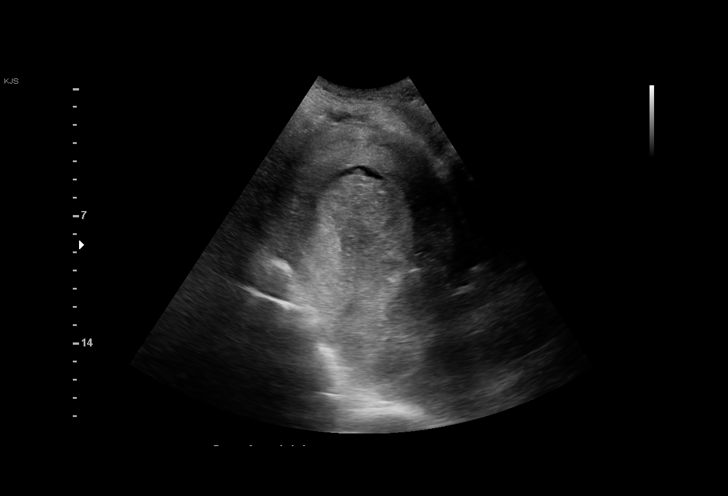
[im 3/33]
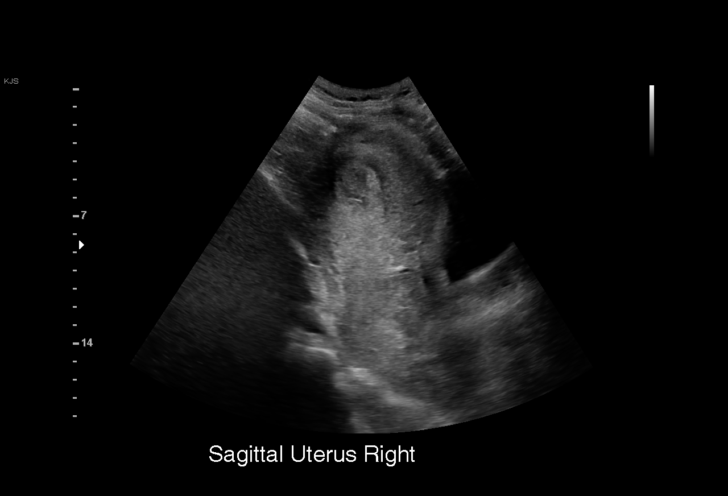
[im 6/33]
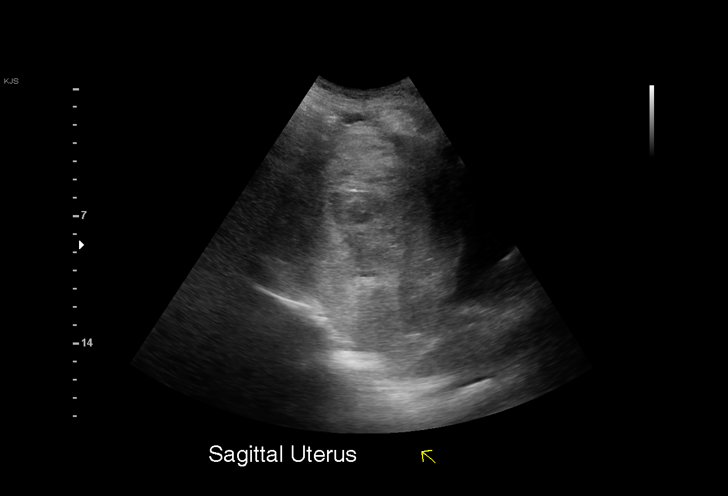
[im 7/33]
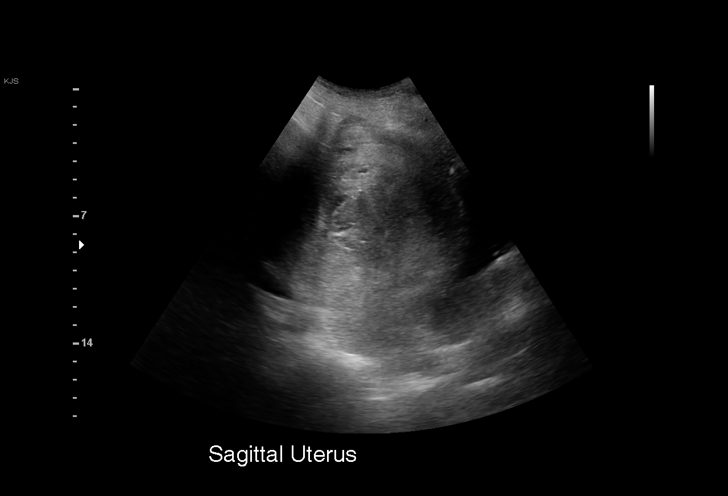
[im 10/33]
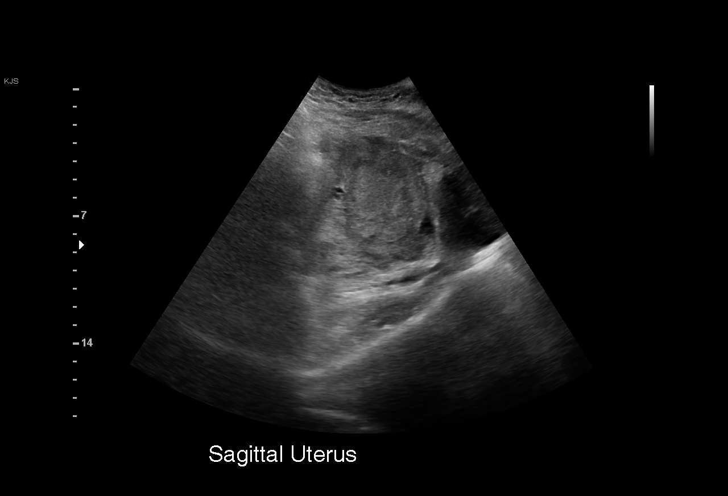
[im 13/33]
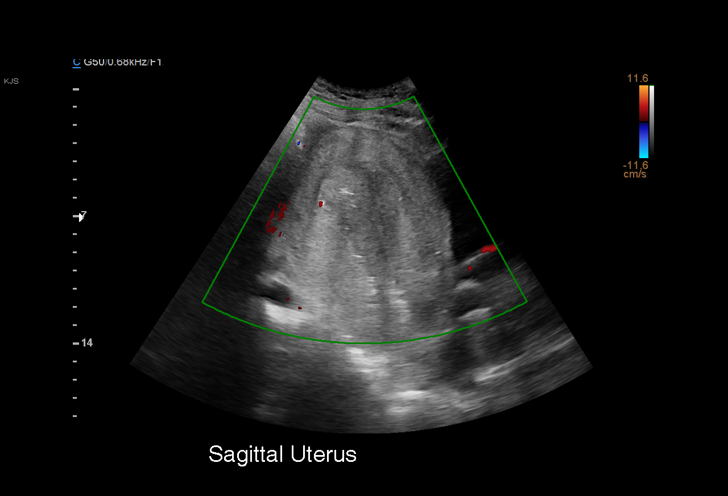
[im 14/33]
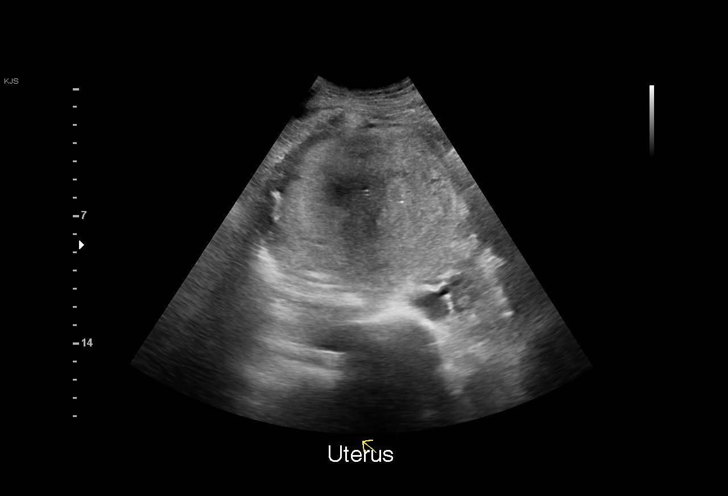
[im 17/33]
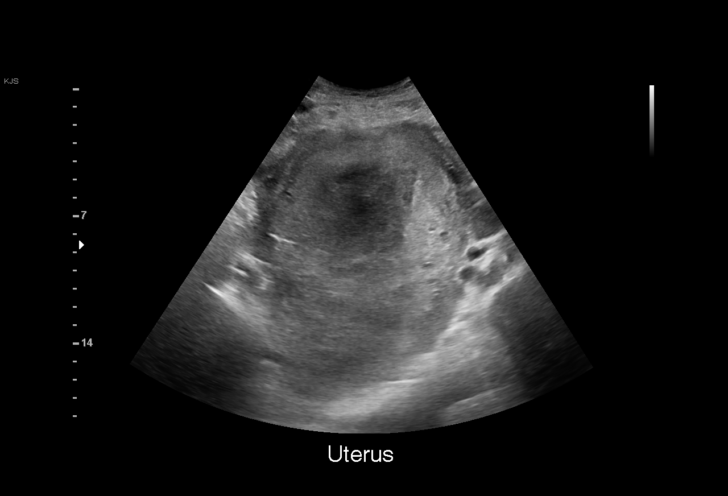
[im 20/33]
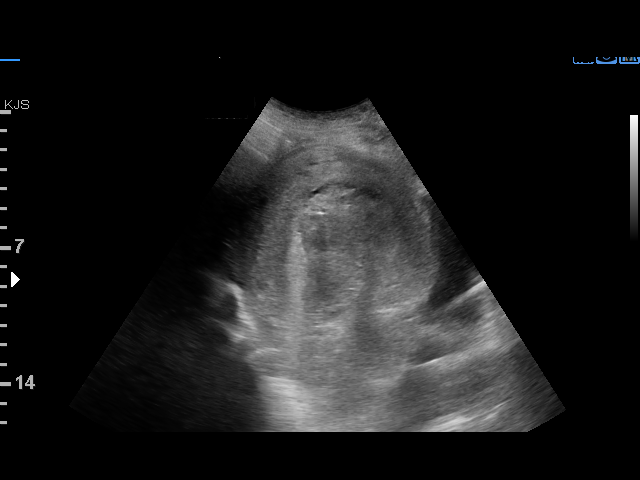
[im 21/33]
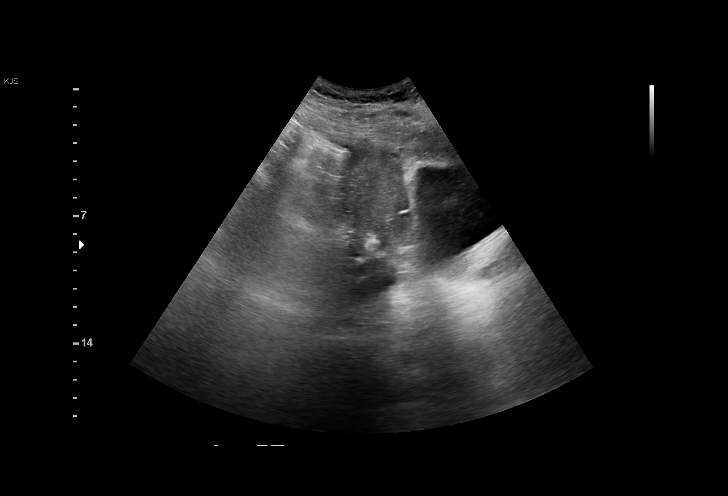
[im 24/33]
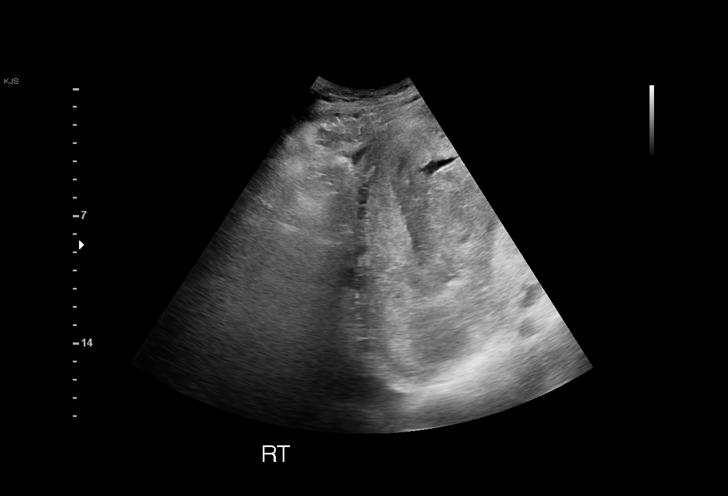
[im 27/33]
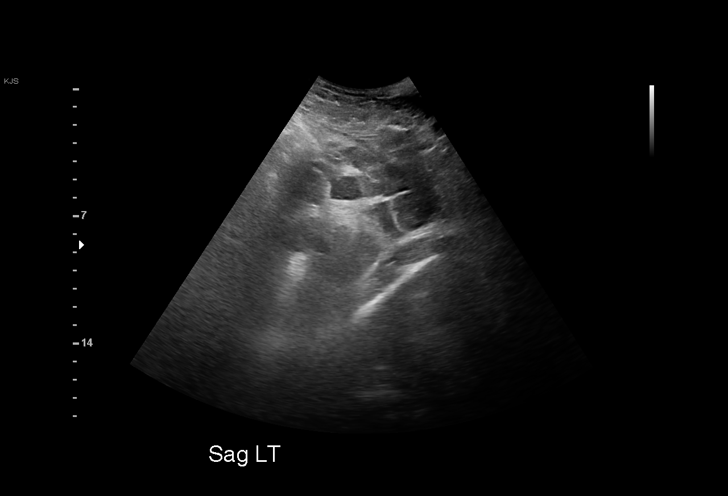
[im 28/33]
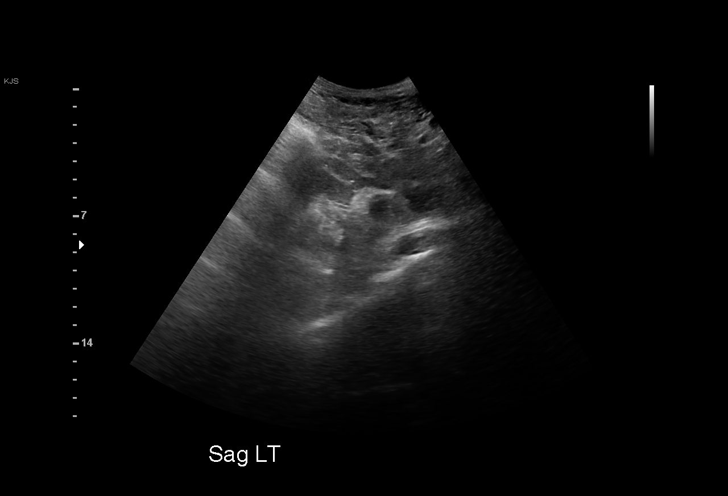
[im 31/33]
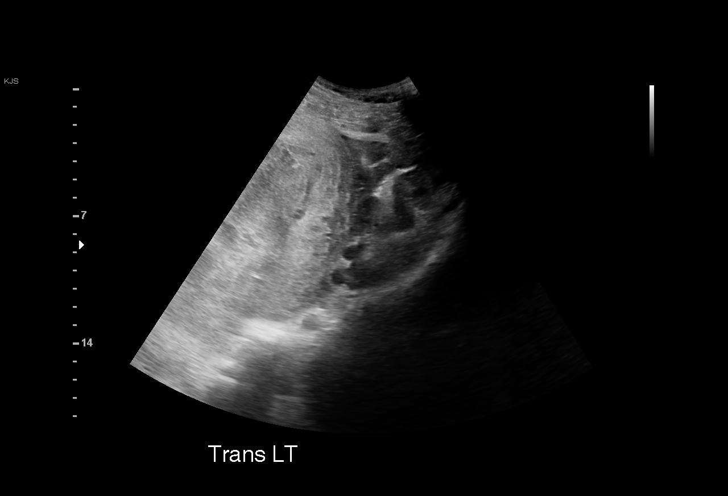

[Series 2: us pelvis complete · 1 of 2 slices shown (2 of 2)]
[im 1/2]
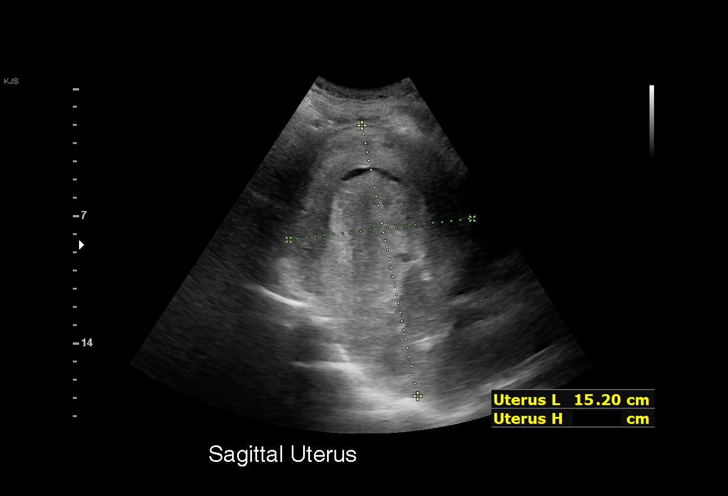

[15 of 25 positions shown; findings below may reference images not displayed]

FINDINGS: Measurements: 15.2 x 10.2 x 11.2 = volume: 904 mL. No fibroids or
other mass visualized.

Endometrium

Thickness: 5 cm. No abnormal vascularity to suggest retained
products of conception.

Right ovary

Not visualized

Left ovary

Not visualized
IMPRESSION: 1. Enlarged post gravid uterus.
2. Heterogeneously thickened endometrium which may represent blood
products. No vascular flow to suggest retained products of
conception.

3. Bilateral ovaries are not visualized.

## 2023-12-12 DIAGNOSIS — F411 Generalized anxiety disorder: Secondary | ICD-10-CM | POA: Diagnosis not present

## 2023-12-19 DIAGNOSIS — L814 Other melanin hyperpigmentation: Secondary | ICD-10-CM | POA: Diagnosis not present

## 2023-12-19 DIAGNOSIS — L821 Other seborrheic keratosis: Secondary | ICD-10-CM | POA: Diagnosis not present

## 2023-12-19 DIAGNOSIS — D225 Melanocytic nevi of trunk: Secondary | ICD-10-CM | POA: Diagnosis not present

## 2023-12-19 DIAGNOSIS — L578 Other skin changes due to chronic exposure to nonionizing radiation: Secondary | ICD-10-CM | POA: Diagnosis not present

## 2023-12-22 DIAGNOSIS — F411 Generalized anxiety disorder: Secondary | ICD-10-CM | POA: Diagnosis not present
